# Patient Record
Sex: Male | Born: 1965 | Race: White | Hispanic: No | Marital: Married | Smoking: Never smoker
Health system: Southern US, Community
[De-identification: ages and names within clinical notes are randomized; demographics above are authoritative.]

## PROBLEM LIST (undated history)

## (undated) DIAGNOSIS — G253 Myoclonus: Secondary | ICD-10-CM

## (undated) DIAGNOSIS — G500 Trigeminal neuralgia: Secondary | ICD-10-CM

## (undated) DIAGNOSIS — G4730 Sleep apnea, unspecified: Secondary | ICD-10-CM

## (undated) DIAGNOSIS — M542 Cervicalgia: Secondary | ICD-10-CM

## (undated) DIAGNOSIS — K219 Gastro-esophageal reflux disease without esophagitis: Secondary | ICD-10-CM

## (undated) HISTORY — PX: NOSE SURGERY: SHX723

## (undated) HISTORY — DX: Gastro-esophageal reflux disease without esophagitis: K21.9

## (undated) HISTORY — DX: Sleep apnea, unspecified: G47.30

## (undated) HISTORY — PX: HERNIA REPAIR: SHX51

## (undated) HISTORY — DX: Myoclonus: G25.3

## (undated) HISTORY — DX: Cervicalgia: M54.2

## (undated) HISTORY — PX: OTHER SURGICAL HISTORY: SHX169

---

## 1998-03-26 ENCOUNTER — Emergency Department (HOSPITAL_COMMUNITY): Admission: EM | Admit: 1998-03-26 | Discharge: 1998-03-26 | Payer: Self-pay | Admitting: Emergency Medicine

## 2005-09-25 ENCOUNTER — Ambulatory Visit (HOSPITAL_COMMUNITY)
Admission: RE | Admit: 2005-09-25 | Discharge: 2005-09-25 | Payer: Self-pay | Admitting: Orthodontics and Dentofacial Orthopedics

## 2012-05-22 ENCOUNTER — Encounter: Payer: Self-pay | Admitting: *Deleted

## 2012-09-17 ENCOUNTER — Encounter: Payer: Self-pay | Admitting: Cardiovascular Disease

## 2014-06-08 DIAGNOSIS — M4712 Other spondylosis with myelopathy, cervical region: Secondary | ICD-10-CM | POA: Insufficient documentation

## 2015-06-23 ENCOUNTER — Encounter (HOSPITAL_BASED_OUTPATIENT_CLINIC_OR_DEPARTMENT_OTHER): Payer: Self-pay | Admitting: *Deleted

## 2015-06-23 ENCOUNTER — Emergency Department (HOSPITAL_BASED_OUTPATIENT_CLINIC_OR_DEPARTMENT_OTHER)
Admission: EM | Admit: 2015-06-23 | Discharge: 2015-06-23 | Disposition: A | Discharge status: Home / Self Care | Payer: BLUE CROSS/BLUE SHIELD | Attending: Emergency Medicine | Admitting: Emergency Medicine

## 2015-06-23 ENCOUNTER — Emergency Department (HOSPITAL_BASED_OUTPATIENT_CLINIC_OR_DEPARTMENT_OTHER): Payer: BLUE CROSS/BLUE SHIELD

## 2015-06-23 DIAGNOSIS — Z8669 Personal history of other diseases of the nervous system and sense organs: Secondary | ICD-10-CM | POA: Diagnosis not present

## 2015-06-23 DIAGNOSIS — Y9389 Activity, other specified: Secondary | ICD-10-CM | POA: Diagnosis not present

## 2015-06-23 DIAGNOSIS — S59902A Unspecified injury of left elbow, initial encounter: Secondary | ICD-10-CM | POA: Insufficient documentation

## 2015-06-23 DIAGNOSIS — Y998 Other external cause status: Secondary | ICD-10-CM | POA: Diagnosis not present

## 2015-06-23 DIAGNOSIS — Z79899 Other long term (current) drug therapy: Secondary | ICD-10-CM | POA: Insufficient documentation

## 2015-06-23 DIAGNOSIS — W01198A Fall on same level from slipping, tripping and stumbling with subsequent striking against other object, initial encounter: Secondary | ICD-10-CM | POA: Diagnosis not present

## 2015-06-23 DIAGNOSIS — Y9289 Other specified places as the place of occurrence of the external cause: Secondary | ICD-10-CM | POA: Diagnosis not present

## 2015-06-23 DIAGNOSIS — M25522 Pain in left elbow: Secondary | ICD-10-CM

## 2015-06-23 HISTORY — DX: Trigeminal neuralgia: G50.0

## 2015-06-23 NOTE — ED Notes (Signed)
Left elbow injury 4 weeks ago. He hit his arm on his car.

## 2015-06-23 NOTE — Discharge Instructions (Signed)
Rest.  Ibuprofen 600 mg 3 times daily for the next 5 days.  If you're not improving in the next week, follow up with your primary Dr. for referral to physical therapy or possibly further imaging.   Musculoskeletal Pain Musculoskeletal pain is muscle and boney aches and pains. These pains can occur in any part of the body. Your caregiver may treat you without knowing the cause of the pain. They may treat you if blood or urine tests, X-rays, and other tests were normal.  CAUSES There is often not a definite cause or reason for these pains. These pains may be caused by a type of germ (virus). The discomfort may also come from overuse. Overuse includes working out too hard when your body is not fit. Boney aches also come from weather changes. Bone is sensitive to atmospheric pressure changes. HOME CARE INSTRUCTIONS   Ask when your test results will be ready. Make sure you get your test results.  Only take over-the-counter or prescription medicines for pain, discomfort, or fever as directed by your caregiver. If you were given medications for your condition, do not drive, operate machinery or power tools, or sign legal documents for 24 hours. Do not drink alcohol. Do not take sleeping pills or other medications that may interfere with treatment.  Continue all activities unless the activities cause more pain. When the pain lessens, slowly resume normal activities. Gradually increase the intensity and duration of the activities or exercise.  During periods of severe pain, bed rest may be helpful. Lay or sit in any position that is comfortable.  Putting ice on the injured area.  Put ice in a bag.  Place a towel between your skin and the bag.  Leave the ice on for 15 to 20 minutes, 3 to 4 times a day.  Follow up with your caregiver for continued problems and no reason can be found for the pain. If the pain becomes worse or does not go away, it may be necessary to repeat tests or do additional  testing. Your caregiver may need to look further for a possible cause. SEEK IMMEDIATE MEDICAL CARE IF:  You have pain that is getting worse and is not relieved by medications.  You develop chest pain that is associated with shortness or breath, sweating, feeling sick to your stomach (nauseous), or throw up (vomit).  Your pain becomes localized to the abdomen.  You develop any new symptoms that seem different or that concern you. MAKE SURE YOU:   Understand these instructions.  Will watch your condition.  Will get help right away if you are not doing well or get worse. Document Released: 11/25/2005 Document Revised: 02/17/2012 Document Reviewed: 07/30/2013 [facility] Patient Information 2015 [place], [place]. This information is not intended to replace advice given to you by your health care provider. Make sure you discuss any questions you have with your health care provider.

## 2015-06-23 NOTE — ED Provider Notes (Signed)
CSN: [id number]     Arrival date & time 06/23/15  1453 History   First MD Initiated Contact with Patient 06/23/15 1538     Chief Complaint  Patient presents with  . Elbow Injury     (Consider location/radiation/quality/duration/timing/severity/associated sxs/prior Treatment) HPI Comments: Patient is a 49 year old male with no significant past medical history. He presents with complaints of left elbow pain. He states he is getting out of his car 4 weeks ago when he slipped, fell, and struck his elbow on the side of the vehicle. He has been having discomfort since that time. He denies any numbness or tingling. He denies any weakness. This had his pain is worse when he attempts to workout at the gym, and is relieved with rest.  Patient is a 49 y.o. male presenting with arm injury. The history is provided by the patient.  Arm Injury Location:  Elbow Time since incident:  4 weeks Injury: yes   Mechanism of injury: fall   Elbow location:  R elbow Pain details:    Severity:  Moderate   Onset quality:  Sudden   Duration:  4 weeks   Timing:  Constant   Progression:  Worsening Chronicity:  New Dislocation: no   Relieved by:  Rest Worsened by:  Movement   Past Medical History  Diagnosis Date  . Trigeminal neuralgia    Past Surgical History  Procedure Laterality Date  . Nose surgery   age 49   No family history on file. History  Substance Use Topics  . Smoking status: Never Smoker   . Smokeless tobacco: Not on file  . Alcohol Use: Yes     Comment: occasional    Review of Systems  All other systems reviewed and are negative.     Allergies  Review of patient's allergies indicates no known allergies.  Home Medications   Prior to Admission medications   Medication Sig Start Date End Date Taking? Authorizing Provider  Oxcarbazepine (TRILEPTAL) 300 MG tablet Take 300 mg by mouth 2 (two) times daily.    Historical Provider, MD   BP 128/89 mmHg  Pulse 66  Temp(Src) 97.6  F (36.4 C) (Oral)  Resp 20  Ht 5\' 8"  (1.727 m)  Wt 160 lb (72.576 kg)  BMI 24.33 kg/m2  SpO2 99% Physical Exam  Constitutional: He is oriented to person, place, and time. He appears well-developed and well-nourished. No distress.  HENT:  Head: Normocephalic and atraumatic.  Neck: Normal range of motion. Neck supple.  Musculoskeletal:  The left elbow appears grossly normal. There is no significant swelling or deformity. He has good range of motion. Distal ulnar and radial pulses are easily palpable. He is able to flex, extend, and oppose all fingers. Sensation is intact throughout the entire hand.  Neurological: He is alert and oriented to person, place, and time.  Skin: Skin is warm and dry. He is not diaphoretic.  Nursing note and vitals reviewed.   ED Course  Procedures (including critical care time) Labs Review Labs Reviewed - No data to display  Imaging Review Dg Elbow Complete Left  06/23/2015   CLINICAL DATA:  Injured 4 weeks ago 0 after slipping and falling backwards, striking the elbow on an automobile.  EXAM: LEFT ELBOW - COMPLETE 3+ VIEW  COMPARISON:  None.  FINDINGS: There is no evidence of fracture, dislocation, or joint effusion. There is no evidence of arthropathy or other focal bone abnormality. Soft tissues are unremarkable.  IMPRESSION: Normal   Electronically Signed  By: Paulina Fusi M.D.   On: 06/23/2015 15:35     EKG Interpretation None      MDM   Final diagnoses:  None    Patient appears to have some sort of soft tissue injury as his x-rays are negative. I suspect tendinitis. I will recommend rest, anti-inflammatory's, and when necessary return with his primary doctor.    Geoffery Lyons, MD 06/23/15 [phone]

## 2015-07-16 ENCOUNTER — Emergency Department (HOSPITAL_COMMUNITY): Payer: BLUE CROSS/BLUE SHIELD

## 2015-07-16 ENCOUNTER — Encounter (HOSPITAL_COMMUNITY): Payer: Self-pay | Admitting: Emergency Medicine

## 2015-07-16 ENCOUNTER — Inpatient Hospital Stay (HOSPITAL_COMMUNITY)
Admission: EM | Admit: 2015-07-16 | Discharge: 2015-07-17 | DRG: 305 | Disposition: A | Discharge status: Home / Self Care | Payer: BLUE CROSS/BLUE SHIELD | Attending: Family Medicine | Admitting: Family Medicine

## 2015-07-16 DIAGNOSIS — R739 Hyperglycemia, unspecified: Secondary | ICD-10-CM | POA: Diagnosis present

## 2015-07-16 DIAGNOSIS — J309 Allergic rhinitis, unspecified: Secondary | ICD-10-CM | POA: Diagnosis present

## 2015-07-16 DIAGNOSIS — R7309 Other abnormal glucose: Secondary | ICD-10-CM

## 2015-07-16 DIAGNOSIS — H538 Other visual disturbances: Secondary | ICD-10-CM | POA: Diagnosis present

## 2015-07-16 DIAGNOSIS — G500 Trigeminal neuralgia: Secondary | ICD-10-CM | POA: Diagnosis present

## 2015-07-16 DIAGNOSIS — M25522 Pain in left elbow: Secondary | ICD-10-CM | POA: Diagnosis present

## 2015-07-16 DIAGNOSIS — Z79899 Other long term (current) drug therapy: Secondary | ICD-10-CM | POA: Diagnosis not present

## 2015-07-16 DIAGNOSIS — R0789 Other chest pain: Secondary | ICD-10-CM | POA: Diagnosis present

## 2015-07-16 DIAGNOSIS — I161 Hypertensive emergency: Secondary | ICD-10-CM | POA: Diagnosis present

## 2015-07-16 DIAGNOSIS — R079 Chest pain, unspecified: Secondary | ICD-10-CM | POA: Diagnosis not present

## 2015-07-16 DIAGNOSIS — I10 Essential (primary) hypertension: Secondary | ICD-10-CM | POA: Diagnosis present

## 2015-07-16 DIAGNOSIS — R1310 Dysphagia, unspecified: Secondary | ICD-10-CM | POA: Diagnosis present

## 2015-07-16 LAB — APTT: aPTT: 28 seconds (ref 24–37)

## 2015-07-16 LAB — ETHANOL: Alcohol, Ethyl (B): <5 mg/dL (ref <5)

## 2015-07-16 LAB — BASIC METABOLIC PANEL
Anion gap: 8 (ref 5–15)
BUN: 12 mg/dL (ref 6–20)
CALCIUM: 10 mg/dL (ref 8.9–10.3)
CO2: 30 mmol/L (ref 22–32)
CREATININE: 1.02 mg/dL (ref 0.61–1.24)
Chloride: 96 mmol/L — ABNORMAL LOW (ref 101–111)
GFR calc Af Amer: >60 mL/min (ref >60)
GFR calc non Af Amer: >60 mL/min (ref >60)
Glucose, Bld: 128 mg/dL — ABNORMAL HIGH (ref 65–99)
POTASSIUM: 3.9 mmol/L (ref 3.5–5.1)
Sodium: 134 mmol/L — ABNORMAL LOW (ref 135–145)

## 2015-07-16 LAB — I-STAT CHEM 8, ED
BUN: 15 mg/dL (ref 6–20)
Calcium, Ion: 1.25 mmol/L — ABNORMAL HIGH (ref 1.12–1.23)
Chloride: 94 mmol/L — ABNORMAL LOW (ref 101–111)
Creatinine, Ser: 1 mg/dL (ref 0.61–1.24)
Glucose, Bld: 124 mg/dL — ABNORMAL HIGH (ref 65–99)
HEMATOCRIT: 45 % (ref 39.0–52.0)
Hemoglobin: 15.3 g/dL (ref 13.0–17.0)
Potassium: 3.8 mmol/L (ref 3.5–5.1)
Sodium: 135 mmol/L (ref 135–145)
TCO2: 28 mmol/L (ref 0–100)

## 2015-07-16 LAB — URINALYSIS, ROUTINE W REFLEX MICROSCOPIC
Bilirubin Urine: NEGATIVE
GLUCOSE, UA: NEGATIVE mg/dL
Hgb urine dipstick: NEGATIVE
Ketones, ur: NEGATIVE mg/dL
Leukocytes, UA: NEGATIVE
Nitrite: NEGATIVE
PROTEIN: NEGATIVE mg/dL
Specific Gravity, Urine: 1.015 (ref 1.005–1.030)
Urobilinogen, UA: 0.2 mg/dL (ref 0.0–1.0)
pH: 7.5 (ref 5.0–8.0)

## 2015-07-16 LAB — CBC
HCT: 40.8 % (ref 39.0–52.0)
Hemoglobin: 14.7 g/dL (ref 13.0–17.0)
MCH: 33.6 pg (ref 26.0–34.0)
MCHC: 36 g/dL (ref 30.0–36.0)
MCV: 93.2 fL (ref 78.0–100.0)
Platelets: 177 10*3/uL (ref 150–400)
RBC: 4.38 MIL/uL (ref 4.22–5.81)
RDW: 12 % (ref 11.5–15.5)
WBC: 5.7 10*3/uL (ref 4.0–10.5)

## 2015-07-16 LAB — COMPREHENSIVE METABOLIC PANEL
ALT: 25 U/L (ref 17–63)
AST: 46 U/L — ABNORMAL HIGH (ref 15–41)
Albumin: 4.6 g/dL (ref 3.5–5.0)
Alkaline Phosphatase: 62 U/L (ref 38–126)
Anion gap: 8 (ref 5–15)
BILIRUBIN TOTAL: 0.2 mg/dL — AB (ref 0.3–1.2)
BUN: 12 mg/dL (ref 6–20)
CO2: 30 mmol/L (ref 22–32)
Calcium: 9.9 mg/dL (ref 8.9–10.3)
Chloride: 97 mmol/L — ABNORMAL LOW (ref 101–111)
Creatinine, Ser: 1.02 mg/dL (ref 0.61–1.24)
GFR calc non Af Amer: >60 mL/min (ref >60)
GLUCOSE: 126 mg/dL — AB (ref 65–99)
Potassium: 3.8 mmol/L (ref 3.5–5.1)
SODIUM: 135 mmol/L (ref 135–145)
Total Protein: 7 g/dL (ref 6.5–8.1)

## 2015-07-16 LAB — RAPID URINE DRUG SCREEN, HOSP PERFORMED
Amphetamines: NOT DETECTED
Barbiturates: NOT DETECTED
Benzodiazepines: NOT DETECTED
COCAINE: NOT DETECTED
Opiates: NOT DETECTED
Tetrahydrocannabinol: NOT DETECTED

## 2015-07-16 LAB — DIFFERENTIAL
BASOS PCT: 0 % (ref 0–1)
Basophils Absolute: 0 10*3/uL (ref 0.0–0.1)
Eosinophils Absolute: 0 10*3/uL (ref 0.0–0.7)
Eosinophils Relative: 1 % (ref 0–5)
LYMPHS ABS: 1.2 10*3/uL (ref 0.7–4.0)
Lymphocytes Relative: 22 % (ref 12–46)
MONOS PCT: 9 % (ref 3–12)
Monocytes Absolute: 0.5 10*3/uL (ref 0.1–1.0)
Neutro Abs: 3.8 10*3/uL (ref 1.7–7.7)
Neutrophils Relative %: 68 % (ref 43–77)

## 2015-07-16 LAB — PROTIME-INR
INR: 1.07 (ref 0.00–1.49)
Prothrombin Time: 14.1 seconds (ref 11.6–15.2)

## 2015-07-16 LAB — I-STAT TROPONIN, ED: Troponin i, poc: 0 ng/mL (ref 0.00–0.08)

## 2015-07-16 MED ORDER — LABETALOL HCL 5 MG/ML IV SOLN
10.0000 mg | Freq: Once | INTRAVENOUS | Status: AC
  Administered 2015-07-16: 10 mg via INTRAVENOUS
  Filled 2015-07-16: qty 4

## 2015-07-16 NOTE — ED Provider Notes (Signed)
CSN: [id number]     Arrival date & time 07/16/15  2111 History   First MD Initiated Contact with Patient 07/16/15 2132     Chief Complaint  Patient presents with  . Blurred Vision  . Chest Pain  . Hypertension  . Code Stroke     (Consider location/radiation/quality/duration/timing/severity/associated sxs/prior Treatment) HPI  49 year old male presents by EMS for acute blurry vision. Started around 8 PM while he was eating dinner. He states he has never had this before and has no past medical history except trigeminal neuralgia. No prior history of hypertension and states every time he is checks blood pressure it has been normal. The patient states that it feels like his eyes are blurry and there is no double vision. This affects both eyes. The patient also states he's been feeling weak in his left arm from the elbow down for the past one month. One month ago he fell and landed on his elbow and since then has been having pain and weakness. No numbness. The patient developed left chest pressure about 15 minutes after the blurry vision started. This is been waxing and waning but not gone. No shortness of breath. No nausea or vomiting. EMS gave him one nitroglycerin due to hypertension and he states since then he has developed a headache but did not have a headache before. When he first developed the blurry vision family decided to come to the hospital but when he stood up he was dizzy and had to sit back down his legs were weak.  Past Medical History  Diagnosis Date  . Trigeminal neuralgia    Past Surgical History  Procedure Laterality Date  . Nose surgery   age 37   History reviewed. No pertinent family history. History  Substance Use Topics  . Smoking status: Never Smoker   . Smokeless tobacco: Not on file  . Alcohol Use: Yes     Comment: occasional    Review of Systems  Eyes: Positive for visual disturbance. Negative for pain.  Respiratory: Negative for shortness of breath.    Cardiovascular: Positive for chest pain.  Gastrointestinal: Negative for vomiting.  Neurological: Positive for dizziness, weakness and headaches.  All other systems reviewed and are negative.     Allergies  Review of patient's allergies indicates no known allergies.  Home Medications   Prior to Admission medications   Medication Sig Start Date End Date Taking? Authorizing Provider  Oxcarbazepine (TRILEPTAL) 300 MG tablet Take 300 mg by mouth 2 (two) times daily.    Historical Provider, MD   BP 190/106 mmHg  Pulse 80  Temp(Src) 98.8 F (37.1 C) (Oral)  Resp 16  SpO2 97% Physical Exam  Constitutional: He is oriented to person, place, and time. He appears well-developed and well-nourished.  HENT:  Head: Normocephalic and atraumatic.  Right Ear: External ear normal.  Left Ear: External ear normal.  Nose: Nose normal.  Eyes: Conjunctivae and EOM are normal. Pupils are equal, round, and reactive to light. Right eye exhibits no discharge. Left eye exhibits no discharge.  Neck: Neck supple.  Cardiovascular: Normal rate, regular rhythm, normal heart sounds and intact distal pulses.   Pulmonary/Chest: Effort normal and breath sounds normal.  Abdominal: Soft. There is no tenderness.  Musculoskeletal: He exhibits no edema.  Neurological: He is alert and oriented to person, place, and time.  CN 2-12 grossly intact. 5/5 strength in all 4 extremities. Grossly normal sensation. Normal finger to nose. When patient covers up one eye the other eye's  vision is normal bilaterally. Blurred vision when both eyes open.  Skin: Skin is warm and dry.  Nursing note and vitals reviewed.   ED Course  Procedures (including critical care time) Labs Review Labs Reviewed  BASIC METABOLIC PANEL - Abnormal; Notable for the following:    Sodium 134 (*)    Chloride 96 (*)    Glucose, Bld 128 (*)    All other components within normal limits  COMPREHENSIVE METABOLIC PANEL - Abnormal; Notable for the  following:    Chloride 97 (*)    Glucose, Bld 126 (*)    AST 46 (*)    Total Bilirubin 0.2 (*)    All other components within normal limits  I-STAT CHEM 8, ED - Abnormal; Notable for the following:    Chloride 94 (*)    Glucose, Bld 124 (*)    Calcium, Ion 1.25 (*)    All other components within normal limits  CBC  ETHANOL  DIFFERENTIAL  PROTIME-INR  APTT  URINE RAPID DRUG SCREEN, HOSP PERFORMED  URINALYSIS, ROUTINE W REFLEX MICROSCOPIC (NOT AT [facility])  I-STAT TROPOININ, ED    Imaging Review Ct Head Wo Contrast  07/16/2015   CLINICAL DATA:  Acute onset blurred vision with nausea and dizziness ; left upper extremity tingling  EXAM: CT HEAD WITHOUT CONTRAST  TECHNIQUE: Contiguous axial images were obtained from the base of the skull through the vertex without intravenous contrast.  COMPARISON:  January 12, 2011  FINDINGS: The ventricles are normal in size and configuration. There is no intracranial mass, hemorrhage, extra-axial fluid collection, or midline shift. Gray and white compartments appear normal. No acute infarct evident. Middle cerebral arteries show normal attenuation bilaterally. Bony calvarium appears intact. The mastoid air cells are clear.  IMPRESSION: Study within normal limits.  Critical Value/emergent results were called by telephone at the time of interpretation on 07/16/2015 at 10:25 pm to Dr. Thad Ranger, neurology , who verbally acknowledged these results.   Electronically Signed   By: Bretta Bang III M.D.   On: 07/16/2015 22:25     EKG Interpretation   Date/Time:  Sunday July 16 2015 21:33:21 EDT Ventricular Rate:  81 PR Interval:  155 QRS Duration: 107 QT Interval:  366 QTC Calculation: 425 R Axis:   86 Text Interpretation:  Sinus rhythm Probable left atrial enlargement  Anteroseptal infarct, old No old tracing to compare Confirmed by Dashon Mcintire   MD, Shelbylynn Walczyk ([id number]) on 07/16/2015 9:40:22 PM      CRITICAL CARE Performed by: Pricilla Loveless T   Total  critical care time: 30 minutes  Critical care time was exclusive of separately billable procedures and treating other patients.  Critical care was necessary to treat or prevent imminent or life-threatening deterioration.  Critical care was time spent personally by me on the following activities: development of treatment plan with patient and/or surrogate as well as nursing, discussions with consultants, evaluation of patient's response to treatment, examination of patient, obtaining history from patient or surrogate, ordering and performing treatments and interventions, ordering and review of laboratory studies, ordering and review of radiographic studies, pulse oximetry and re-evaluation of patient's condition.  MDM   Final diagnoses:  Hypertensive emergency    9:57 PM Discussed with Dr. Thad Ranger of neurology. At this point we will call a code stroke given time of onset and symptoms. Patient will go to CT scan and then further disposition and workup will be made.  Patient's symptoms have improved after being given IV labetalol by neuro. Given this,  this is more likely hypertensive emergency rather than acute stroke. No further stroke workup indicated. Will need admission for tighter BP control and monitoring. His chest pain is somewhat atypical and given no EKG changes or troponin elevation I do not feel that this is ACS. Will need tighter blood pressure control, admit to the step down unit under family medicine.  Pricilla Loveless, MD 07/16/15 2322

## 2015-07-16 NOTE — ED Notes (Signed)
Patient stated denies any blurry vision.

## 2015-07-16 NOTE — ED Notes (Signed)
Pt reports eating dinner and experiencing sudden onset of blurred vision; pt also reports substernal CP that is intermittent that began shortly after with nausea and dizziness with standing; pt received  ASA and 1 SL nitro enroute by EMS without improvement; pt also received  zofran; pt also reports tingling in L arm- sts he hit arm while back and still gives him problems (has been seen for this)

## 2015-07-16 NOTE — ED Notes (Signed)
ED Doctor at bedside.  

## 2015-07-16 NOTE — H&P (Signed)
Family Medicine Teaching [facility] Admission History and Physical Service Pager: [phone]  Patient name: Ronald Chapman Medical record number: [id number] Date of birth: [birth year] Age: 49 y.o. Gender: male  Primary Care Provider: Cheral Bay, MD Consultants: Neuro (in ED) Code Status: Full  Chief Complaint: blurry vision  Assessment and Plan: Ronald Chapman is a 49 y.o. male presenting with hypertensive emergency. PMH is significant for trigeminal neuralgia  # Hypertensive emergency: 190s/110. symptoms of blurry vision. No evidence of kidney or liver damage. No prior diagnosis of HTN. Code stroke called in ED and head CT was negatuve of pathology. Received 10mg  IV labetalol with excellent BP response and resolution of blurry vision. Evaluated by neurology and felt this was hypertensive related and not stroke/CVA.  - labetalol IV PRN overnight for SBP >180, DBP >110 - if BPs still elevated consider oral agent in AM - telemetry  # Chest pain: likely secondary to above. Initial troponin negative. EKG NSR, ?incomplete RBBB. CXR no acute pathology.  - cycle troponins - start daily ASA  # Elevated blood sugar: likely due to stress/pain reaction - if AM BMP still elevated, check A1c  # Trigeminal neuralgia: had recent MRI brain for monitoring as it has been giving him more pain lately (last MRI was ~8 years ago). - continue trileptal  # Left elbow/arm pain: has ortho appt this week. In setting of fall around June 2016. Had normal plain film in July. - monitor clinically  FEN/GI: diet reg / saline lock Prophylaxis: heparin sq  Disposition: admit  History of Present Illness: Ronald Chapman is a 49 y.o. male presenting with blurry vision. Pt states he was eating dinner around 8pm when he developed blurry vision. It subsided for a few minutes but came back worse and also developed chest pain around the same time. Chest pain was located around left side, felt like a  squeezing. He called a friend (doctor) who told him to go to the ED. EMS was called and gave nitro en route (promptly gave him a headache). While in the ED he was given IV labetalol which brought his BP down well and he states the blurry vision had completely resolved. He states he still has a small amount of chest pain in his upper left chest, continues to feel like a squeezing but is improved overall.   Review Of Systems: Per HPI with the following additions: some difficulty swallowing over past few weeks, no HA prior to nitro, no fever/chills, no n/v, no weakness. Otherwise 12 point review of systems was performed and was unremarkable.  Patient Active Problem List   Diagnosis Date Noted  . Hypertensive emergency 07/16/2015   Past Medical History: Past Medical History  Diagnosis Date  . Trigeminal neuralgia    Past Surgical History: Past Surgical History  Procedure Laterality Date  . Nose surgery   age 34   Social History: History  Substance Use Topics  . Smoking status: Never Smoker   . Smokeless tobacco: Not on file  . Alcohol Use: Yes     Comment: occasional   Additional social history: works as a IT trainer, very stressful in past 6 months, drinking more alcohol for this and for his trigeminal neuralgia  Please also refer to relevant sections of EMR.  Family History: History reviewed. No pertinent family history. Allergies and Medications: No Known Allergies No current facility-administered medications on file prior to encounter.   Current Outpatient Prescriptions on File Prior to Encounter  Medication Sig Dispense Refill  .  Oxcarbazepine (TRILEPTAL) 300 MG tablet Take 300 mg by mouth 2 (two) times daily.      Objective: BP 148/97 mmHg  Pulse 81  Temp(Src) 98.8 F (37.1 C) (Oral)  Resp 16  Wt 160 lb 0.9 oz (72.6 kg)  SpO2 99% Exam: General: NAD Eyes: PERRL, EOMI. Conjunctiva normal. ENTM: MMM, no oropharyngeal lesions Neck: normal, supple Cardiovascular: RRR,  normal s1 and s2, no m/r/g. 2+ radial, DP pulses bilaterally. No LE edema. Respiratory: CTAB, normal effort Abdomen: soft, thin, NTND, normal bowel sounds MSK: normal bulk/tone. No deformities. ROM left elbow intact. Skin: no rashes Neuro: alert and oriented x 3. CN2-12 normal. No pronator drift. Strength grossly normal UE/LE bilaterally. Psych: normal mood and affect. Thought content is normal. Speech is normal.  Labs and Imaging: CBC BMET   Recent Labs Lab 07/16/15 2152 07/16/15 2205  WBC 5.7  --   HGB 14.7 15.3  HCT 40.8 45.0  PLT 177  --     Recent Labs Lab 07/16/15 2152 07/16/15 2205  NA 135  134* 135  K 3.8  3.9 3.8  CL 97*  96* 94*  CO2 30  30  --   BUN CREATININE 1.02  1.02 1.00  GLUCOSE 126*  128* 124*  CALCIUM 9.9  10.0  --      Dg Chest 2 View  07/16/2015   CLINICAL DATA:  Acute onset of blurred vision and substernal chest pain. Nausea and dizziness. High blood pressure. Initial encounter.  EXAM: CHEST  2 VIEW  COMPARISON:  None.  FINDINGS: The lungs are well-aerated and clear. There is no evidence of focal opacification, pleural effusion or pneumothorax.  The heart is normal in size; the mediastinal contour is within normal limits. No acute osseous abnormalities are seen.  IMPRESSION: No acute cardiopulmonary process seen.   Electronically Signed   By: Roanna Raider M.D.   On: 07/16/2015 23:39   Dg Elbow Complete Left  06/23/2015   CLINICAL DATA:  Injured 4 weeks ago 0 after slipping and falling backwards, striking the elbow on an automobile.  EXAM: LEFT ELBOW - COMPLETE 3+ VIEW  COMPARISON:  None.  FINDINGS: There is no evidence of fracture, dislocation, or joint effusion. There is no evidence of arthropathy or other focal bone abnormality. Soft tissues are unremarkable.  IMPRESSION: Normal   Electronically Signed   By: Paulina Fusi M.D.   On: 06/23/2015 15:35   Ct Head Wo Contrast  07/16/2015   CLINICAL DATA:  Acute onset blurred vision with  nausea and dizziness ; left upper extremity tingling  EXAM: CT HEAD WITHOUT CONTRAST  TECHNIQUE: Contiguous axial images were obtained from the base of the skull through the vertex without intravenous contrast.  COMPARISON:  January 12, 2011  FINDINGS: The ventricles are normal in size and configuration. There is no intracranial mass, hemorrhage, extra-axial fluid collection, or midline shift. Gray and white compartments appear normal. No acute infarct evident. Middle cerebral arteries show normal attenuation bilaterally. Bony calvarium appears intact. The mastoid air cells are clear.  IMPRESSION: Study within normal limits.  Critical Value/emergent results were called by telephone at the time of interpretation on 07/16/2015 at 10:25 pm to Dr. Thad Ranger, neurology , who verbally acknowledged these results.   Electronically Signed   By: Bretta Bang III M.D.   On: 07/16/2015 22:25     Nani Ravens, MD 07/16/2015, 11:06 PM PGY-3, [facility] FPTS Intern pager: [phone], text pages welcome

## 2015-07-16 NOTE — Consult Note (Signed)
Referring Physician: Criss Alvine    Chief Complaint: Blurry vision  HPI: BETZALEL UMBARGER is an 49 y.o. male who was eating this evening and had acute onset blurry vision.  Patient also experienced chest pain with some associated nausea and dizziness with standing.  On testing vision improves if either eye is covered.  EMS was called.  NTG was administered after which the patient developed a headache.  BP elevated.  On arrival patient also reports that his legs feel heavy.    Date last known well: Date: 07/16/2015 Time last known well: Time: 20:00 tPA Given: No: Resolution of symptoms  Past Medical History  Diagnosis Date  . Trigeminal neuralgia     Past Surgical History  Procedure Laterality Date  . Nose surgery   age 47    History reviewed. No pertinent family history.   Social History:  reports that he has never smoked. He does not have any smokeless tobacco history on file. He reports that he drinks alcohol. He reports that he does not use illicit drugs.  Allergies: No Known Allergies  Medications: I have reviewed the patient's current medications. Prior to Admission:  Prior to Admission medications   Medication Sig Start Date End Date Taking? Authorizing Provider  fexofenadine (ALLEGRA) 180 MG tablet Take 180 mg by mouth daily as needed for allergies or rhinitis.   Yes Historical Provider, MD  Oxcarbazepine (TRILEPTAL) 300 MG tablet Take 600 mg by mouth 3 (three) times daily.    Yes Historical Provider, MD    ROS: History obtained from the patient  General ROS: negative for - chills, fatigue, fever, night sweats, weight gain or weight loss Psychological ROS: negative for - behavioral disorder, hallucinations, memory difficulties, mood swings or suicidal ideation Ophthalmic ROS: negative for - blurry vision, double vision, eye pain or loss of vision ENT ROS: negative for - epistaxis, nasal discharge, oral lesions, sore throat, tinnitus or vertigo Allergy and Immunology ROS:  negative for - hives or itchy/watery eyes Hematological and Lymphatic ROS: negative for - bleeding problems, bruising or swollen lymph nodes Endocrine ROS: negative for - galactorrhea, hair pattern changes, polydipsia/polyuria or temperature intolerance Respiratory ROS: negative for - cough, hemoptysis, shortness of breath or wheezing Cardiovascular ROS: chest pain Gastrointestinal ROS: negative for - abdominal pain, diarrhea, hematemesis, nausea/vomiting or stool incontinence Genito-Urinary ROS: negative for - dysuria, hematuria, incontinence or urinary frequency/urgency Musculoskeletal ROS: elbow pain Neurological ROS: as noted in HPI Dermatological ROS: negative for rash and skin lesion changes  Physical Examination: Blood pressure 122/87, pulse 67, temperature 98.4 F (36.9 C), temperature source Oral, resp. rate 15, weight 72.6 kg (160 lb 0.9 oz), SpO2 95 %.  HEENT-  Normocephalic, no lesions, without obvious abnormality.  Normal external eye and conjunctiva.  Normal TM's bilaterally.  Normal auditory canals and external ears. Normal external nose, mucus membranes and septum.  Normal pharynx. Cardiovascular- S1, S2 normal, pulses palpable throughout   Lungs- chest clear, no wheezing, rales, normal symmetric air entry Abdomen- soft, non-tender; bowel sounds normal; no masses,  no organomegaly Extremities- no edema Lymph-no adenopathy palpable Musculoskeletal-no joint tenderness, deformity or swelling Skin-warm and dry, no hyperpigmentation, vitiligo, or suspicious lesions  Neurological Examination Mental Status: Alert, oriented, thought content appropriate.  Speech fluent without evidence of aphasia.  Able to follow 3 step commands without difficulty. Cranial Nerves: II: Discs flat bilaterally; Visual fields grossly normal, pupils equal, round, reactive to light and accommodation III,IV, VI: ptosis not present, extra-ocular motions intact bilaterally V,VII: smile symmetric, facial  light touch sensation decreased on the right VIII: hearing normal bilaterally IX,X: gag reflex present XI: bilateral shoulder shrug XII: midline tongue extension Motor: Right : Upper extremity   5/5    Left:     Upper extremity   5/5  Lower extremity   5/5     Lower extremity   5/5 Tone and bulk:normal tone throughout; no atrophy noted Sensory: Pinprick and light touch intact throughout, bilaterally Deep Tendon Reflexes: 2+ and symmetric throughout Plantars: Right: downgoing   Left: downgoing Cerebellar: normal finger-to-nose and normal heel-to-shin testing bilaterally Gait: not tested due to feeling that legs are heavy   Laboratory Studies:  Basic Metabolic Panel:  Recent Labs Lab 07/16/15 2152 07/16/15 2205  NA 135  134* 135  K 3.8  3.9 3.8  CL 97*  96* 94*  CO2 30  30  --   GLUCOSE 126*  128* 124*  BUN 12  12 15   CREATININE 1.02  1.02 1.00  CALCIUM 9.9  10.0  --     Liver Function Tests:  Recent Labs Lab 07/16/15 2152  AST 46*  ALT 25  ALKPHOS 62  BILITOT 0.2*  PROT 7.0  ALBUMIN 4.6   No results for input(s): LIPASE, AMYLASE in the last 168 hours. No results for input(s): AMMONIA in the last 168 hours.  CBC:  Recent Labs Lab 07/16/15 2152 07/16/15 2205  WBC 5.7  --   NEUTROABS 3.8  --   HGB 14.7 15.3  HCT 40.8 45.0  MCV 93.2  --   PLT 177  --     Cardiac Enzymes: No results for input(s): CKTOTAL, CKMB, CKMBINDEX, TROPONINI in the last 168 hours.  BNP: Invalid input(s): POCBNP  CBG: No results for input(s): GLUCAP in the last 168 hours.  Microbiology: No results found for this or any previous visit.  Coagulation Studies:  Recent Labs  07/16/15 2152  LABPROT 14.1  INR 1.07    Urinalysis:   Recent Labs Lab 07/16/15 2309  COLORURINE YELLOW  LABSPEC 1.015  PHURINE 7.5  GLUCOSEU NEGATIVE  HGBUR NEGATIVE  BILIRUBINUR NEGATIVE  KETONESUR NEGATIVE  PROTEINUR NEGATIVE  UROBILINOGEN 0.2  NITRITE NEGATIVE  LEUKOCYTESUR  NEGATIVE    Lipid Panel: No results found for: CHOL, TRIG, HDL, CHOLHDL, VLDL, LDLCALC  HgbA1C: No results found for: HGBA1C  Urine Drug Screen:      Component Value Date/Time   LABOPIA NONE DETECTED 07/16/2015 2309   COCAINSCRNUR NONE DETECTED 07/16/2015 2309   LABBENZ NONE DETECTED 07/16/2015 2309   AMPHETMU NONE DETECTED 07/16/2015 2309   THCU NONE DETECTED 07/16/2015 2309   LABBARB NONE DETECTED 07/16/2015 2309    Alcohol Level:   Recent Labs Lab 07/16/15 2152  ETH <5    Other results: EKG: sinus rhythm at 81 bpm.  Imaging: Dg Chest 2 View  07/16/2015   CLINICAL DATA:  Acute onset of blurred vision and substernal chest pain. Nausea and dizziness. High blood pressure. Initial encounter.  EXAM: CHEST  2 VIEW  COMPARISON:  None.  FINDINGS: The lungs are well-aerated and clear. There is no evidence of focal opacification, pleural effusion or pneumothorax.  The heart is normal in size; the mediastinal contour is within normal limits. No acute osseous abnormalities are seen.  IMPRESSION: No acute cardiopulmonary process seen.   Electronically Signed   By: Roanna Raider M.D.   On: 07/16/2015 23:39   Ct Head Wo Contrast  07/16/2015   CLINICAL DATA:  Acute onset blurred vision with nausea and  dizziness ; left upper extremity tingling  EXAM: CT HEAD WITHOUT CONTRAST  TECHNIQUE: Contiguous axial images were obtained from the base of the skull through the vertex without intravenous contrast.  COMPARISON:  January 12, 2011  FINDINGS: The ventricles are normal in size and configuration. There is no intracranial mass, hemorrhage, extra-axial fluid collection, or midline shift. Gray and white compartments appear normal. No acute infarct evident. Middle cerebral arteries show normal attenuation bilaterally. Bony calvarium appears intact. The mastoid air cells are clear.  IMPRESSION: Study within normal limits.  Critical Value/emergent results were called by telephone at the time of interpretation  on 07/16/2015 at 10:25 pm to Dr. Thad Ranger, neurology , who verbally acknowledged these results.   Electronically Signed   By: Bretta Bang III M.D.   On: 07/16/2015 22:25    Assessment: 49 y.o. male presenting with complaints of blurred vision and bilateral leg heaviness.  BP markedly elevated.  Complaints of chest pain as well.  Head CT independent;y reviewed and shows no acute changes.  Do not suspect acute infarct based on presentation.  Symptoms likely related to elevated BP.  Symptoms improved as BP improved.      Stroke Risk Factors - none  Plan: 1. MRI of the brain without contrast 2. ASA 81mg  daily 3. Telemetry monitoring 4. Frequent neuro checks 5. BP control.  Patient with good response to Labetalol  Case discussed with Dr. Ilene Qua, MD [facility] [phone] 07/17/2015, 1:13 AM

## 2015-07-16 NOTE — ED Notes (Signed)
States had an MRI 3-4 weeks ago of brain unsure of test results.  Reason for MRI worsening right side of face tingling numbness for the past 6 months.

## 2015-07-17 ENCOUNTER — Encounter (HOSPITAL_COMMUNITY): Payer: Self-pay | Admitting: *Deleted

## 2015-07-17 DIAGNOSIS — R079 Chest pain, unspecified: Secondary | ICD-10-CM | POA: Diagnosis present

## 2015-07-17 DIAGNOSIS — G500 Trigeminal neuralgia: Secondary | ICD-10-CM | POA: Diagnosis present

## 2015-07-17 DIAGNOSIS — R0789 Other chest pain: Secondary | ICD-10-CM

## 2015-07-17 DIAGNOSIS — R739 Hyperglycemia, unspecified: Secondary | ICD-10-CM | POA: Diagnosis present

## 2015-07-17 LAB — BASIC METABOLIC PANEL
ANION GAP: 10 (ref 5–15)
BUN: 11 mg/dL (ref 6–20)
CO2: 27 mmol/L (ref 22–32)
Calcium: 9 mg/dL (ref 8.9–10.3)
Chloride: 96 mmol/L — ABNORMAL LOW (ref 101–111)
Creatinine, Ser: 1 mg/dL (ref 0.61–1.24)
GFR calc non Af Amer: >60 mL/min (ref >60)
GLUCOSE: 106 mg/dL — AB (ref 65–99)
POTASSIUM: 4 mmol/L (ref 3.5–5.1)
Sodium: 133 mmol/L — ABNORMAL LOW (ref 135–145)

## 2015-07-17 LAB — TROPONIN I
Troponin I: <0.03 ng/mL (ref <0.031)
Troponin I: <0.03 ng/mL (ref <0.031)

## 2015-07-17 MED ORDER — ASPIRIN EC 81 MG PO TBEC
81.0000 mg | DELAYED_RELEASE_TABLET | Freq: Every day | ORAL | Status: DC
  Administered 2015-07-17: 81 mg via ORAL
  Filled 2015-07-17: qty 1

## 2015-07-17 MED ORDER — PANTOPRAZOLE SODIUM 40 MG PO TBEC: 40.0000 mg | DELAYED_RELEASE_TABLET | Freq: Every day | ORAL | Status: DC

## 2015-07-17 MED ORDER — ACETAMINOPHEN 650 MG RE SUPP: 650.0000 mg | Freq: Four times a day (QID) | RECTAL | Status: DC | PRN

## 2015-07-17 MED ORDER — LORAZEPAM 1 MG PO TABS
1.0000 mg | ORAL_TABLET | Freq: Once | ORAL | Status: AC
Start: 2015-07-17 — End: 2015-07-17
  Administered 2015-07-17: 1 mg via ORAL
  Filled 2015-07-17: qty 1

## 2015-07-17 MED ORDER — OXCARBAZEPINE 300 MG PO TABS
600.0000 mg | ORAL_TABLET | Freq: Three times a day (TID) | ORAL | Status: DC
  Administered 2015-07-17 (×2): 600 mg via ORAL
  Filled 2015-07-17 (×6): qty 2

## 2015-07-17 MED ORDER — LABETALOL HCL 5 MG/ML IV SOLN: 10.0000 mg | INTRAVENOUS | Status: DC | PRN

## 2015-07-17 MED ORDER — SODIUM CHLORIDE 0.9 % IJ SOLN
3.0000 mL | INTRAMUSCULAR | Status: DC | PRN
  Administered 2015-07-17: 3 mL via INTRAVENOUS

## 2015-07-17 MED ORDER — ONDANSETRON HCL 4 MG PO TABS: 4.0000 mg | ORAL_TABLET | Freq: Four times a day (QID) | ORAL | Status: DC | PRN

## 2015-07-17 MED ORDER — ONDANSETRON HCL 4 MG/2ML IJ SOLN: 4.0000 mg | Freq: Four times a day (QID) | INTRAMUSCULAR | Status: DC | PRN

## 2015-07-17 MED ORDER — SODIUM CHLORIDE 0.9 % IV SOLN: 250.0000 mL | INTRAVENOUS | Status: DC | PRN

## 2015-07-17 MED ORDER — HEPARIN SODIUM (PORCINE) 5000 UNIT/ML IJ SOLN
5000.0000 [IU] | Freq: Three times a day (TID) | INTRAMUSCULAR | Status: DC
  Administered 2015-07-17 (×2): 5000 [IU] via SUBCUTANEOUS
  Filled 2015-07-17 (×2): qty 1

## 2015-07-17 MED ORDER — MORPHINE SULFATE 2 MG/ML IJ SOLN
1.0000 mg | INTRAMUSCULAR | Status: DC | PRN
  Filled 2015-07-17: qty 1

## 2015-07-17 MED ORDER — ACETAMINOPHEN 325 MG PO TABS
650.0000 mg | ORAL_TABLET | Freq: Four times a day (QID) | ORAL | Status: DC | PRN
  Administered 2015-07-17: 650 mg via ORAL
  Filled 2015-07-17: qty 2

## 2015-07-17 MED ORDER — ASPIRIN 81 MG PO TBEC: 81.0000 mg | DELAYED_RELEASE_TABLET | Freq: Every day | ORAL | Status: DC

## 2015-07-17 MED ORDER — SODIUM CHLORIDE 0.9 % IJ SOLN: 3.0000 mL | Freq: Two times a day (BID) | INTRAMUSCULAR | Status: DC

## 2015-07-17 MED ORDER — GI COCKTAIL ~~LOC~~
30.0000 mL | Freq: Once | ORAL | Status: AC
  Administered 2015-07-17: 30 mL via ORAL
  Filled 2015-07-17: qty 30

## 2015-07-17 NOTE — ED Notes (Signed)
Pt. Ate 75% of breakfast. 

## 2015-07-17 NOTE — Progress Notes (Signed)
FPTS Interim Progress Note  S: Called to evaluate patient as his wife did not want him to be discharged home. Discharge was planned earlier today per day team. However, patient still complaining of some chest pain about 5/10 per RN.  Wife did not  feel comfortable with discharge because of continued chest pain and wanting to know what exactly caused all of his symptoms (ie elevated BP and chest pain).   Upon entering room patient states that his pain was 3 out of 10. States that it is reproducible with palpation. Denies any radiation symptoms. Nothing has made pain better or worse.  Wife concerned that patient will be on the road traveling and this could happen again and something bad happen.  O: BP 126/91 mmHg  Pulse 72  Temp(Src) 97.9 F (36.6 C) (Oral)  Resp 13  Wt 160 lb 0.9 oz (72.6 kg)  SpO2 98%   General: NAD, alert, well-appearing Cardiac: RRR, no mr/g Chest wall: tenderness to palpation mostly over xyphoid process  A/P: Stable. Chest pain r/o ACS based off neg work-up. Appears to be MSK in nature due to reproducibility. Blood pressures have been normotensive since yesterday's IV labetalol one-time dose.  -Repeated EKG in ED which was unchanged. -GI cocktail given for relief before discharge -Protonix given at discharge -Also handout with details in AVS printout to help them understand what happened better -Will need cardiac outpatient follow-up with stress test - sees a cardiologist Dr. Elease Hashimoto -Also discussed need for close PCP follow-up to monitor BP and symptoms.  -Tried to reassure.  Cause of HTN emergency is unknown. We had r/o stroke and he suffered no end-orange damage. Also cardiac work-up was negative.  -Asked patient to avoid traveling until he can be seen by cardiologist and PCP.   They were agreeable to discharge after reassurance.  Pincus Large, DO 07/17/2015, 7:00 PM PGY-2, [facility] pager [phone]

## 2015-07-17 NOTE — Discharge Summary (Signed)
Family Medicine Teaching [facility] Discharge Summary  Patient name: Ronald Chapman Medical record number: [id number] Date of birth: 1966/07/23 Age: 49 y.o. Gender: male Date of Admission: 07/16/2015  Date of Discharge: 07/17/2015 Admitting Physician: No admitting provider for patient encounter.  Primary Care Provider: Cheral Bay, MD Consultants: Neuro (in ED)  Indication for Hospitalization: hypertensive emergency  Discharge Diagnoses/Problem List:  Recurrent chest pain  Disposition: Home  Discharge Condition: Improved  Discharge Exam:  General: Well-appearing, well-nourished man resting in bed, NAD, with wife at bedside Eyes: PERRL, EOMI.  ENTM: MMM Cardiovascular: RRR, normal s1 and s2, no murmurs, rub or gallops. No LE edema. Respiratory: CTAB, normal effort Abdomen: soft, non-tender, non-distended, + BS  MSK: No pain to palpation across chest wall. Skin: no rashes Neuro: Alert and oriented x 3. No gross focalization. Psych: Normal mood and affect.    Brief Hospital Course:  Mr. Peifer is a 48-y.o. male who presented with blurry vision, nausea and intermittent chest pain with blood pressure elevated to 190s/110. PMH is significant for trigeminal neuralgia. He was given ASA 324 mg and 1 SL nitro by EMS, which did not improve symptoms. In the ED, a code stroke was called. CT head was negative for acute change. He was given 10 mg IV labetalol with excellent blood pressure response and resolution of blurry vision. Neurology cleared.   He was admitted to the [facility] Teaching Service and underwent chest pain rule out. Cycled troponins were all negative. EKG showed only early repolarization ST elevations. CXR showed no acute cardiopulmonary process. Chest pain waxed and waned during admission. Repeat EKG showed no changes. Given that patient's BP returned to the low 110s over 80s throughout course of hospital stay after just one dose of labetalol, patient was not  discharged on any antihypertensive medication due to concern for hypotension. He was given a GI cocktail prior to discharge. Advised patient to reduce drinking. Discharged patient on ASA 81 mg and a trial of protonix 40 mg daily.   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   Issues for Follow Up:  1. Follow-up risk factors with lipid panel and Hbg A1c. 2. Cardiac stress test given intermediate risk for MI  Significant Procedures: None  Significant Labs and Imaging:   Recent Labs Lab 07/16/15 2152 07/16/15 2205  WBC 5.7  --   HGB 14.7 15.3  HCT 40.8 45.0  PLT 177  --     Recent Labs Lab 07/16/15 2152 07/16/15 2205 07/17/15 0616  NA 135  134* 135 133*  K 3.8  3.9 3.8 4.0  CL 97*  96* 94* 96*  CO2 30  30  --  27  GLUCOSE 126*  128* 124* 106*  BUN CREATININE 1.02  1.02 1.00 1.00  CALCIUM 9.9  10.0  --  9.0  ALKPHOS 62  --   --   AST 46*  --   --   ALT 25  --   --   ALBUMIN 4.6  --   --     Results/Tests Pending at Time of Discharge: None  Discharge Medications:    Medication List    TAKE these medications        aspirin 81 MG EC tablet  Take 1 tablet (81 mg total) by mouth daily.     fexofenadine 180 MG tablet  Commonly known as:  ALLEGRA  Take  180 mg by mouth daily as needed for allergies or rhinitis.     Oxcarbazepine 300 MG tablet  Commonly known as:  TRILEPTAL  Take 600 mg by mouth 3 (three) times daily.     pantoprazole 40 MG tablet  Commonly known as:  PROTONIX  Take 1 tablet (40 mg total) by mouth daily.        Discharge Instructions: Please refer to Patient Instructions section of EMR for full details.  Patient was counseled important signs and symptoms that should prompt return to medical care, changes in medications, dietary instructions, activity restrictions, and follow up appointments.   Follow-Up Appointments:  Follow-up  Information    Call Cheral Bay, MD.   Specialty:  Family Medicine   Why:  appointment for hospital follow-up   Contact information:   [address] [facility] [address] [phone]       Call Nahser, Deloris Ping, MD.   Specialty:  Cardiology   Why:  appointment for hospital follow-up and stress test   Contact information:   [address] [place] [address] [phone]       Casey Burkitt, MD 07/17/2015, 10:11 PM PGY-1, [facility]

## 2015-07-17 NOTE — ED Notes (Signed)
Patient moving to POD C to wait for bed assignment, patient and family aware of poc

## 2015-07-17 NOTE — ED Notes (Signed)
Report from Elizabeth, RN

## 2015-07-17 NOTE — ED Notes (Signed)
Pt. Eating breakfast

## 2015-07-17 NOTE — ED Notes (Signed)
Doroteo Glassman, MD, This RN & Thayer Ohm, C. Rn at bedside discussing pt & pts wife concerns, pt to be discharged

## 2015-07-17 NOTE — ED Notes (Signed)
Spoke with patient and wife at bedside.  Indicated wanted medication for "nerves" chest pain pressure tightness increased from 1/10 to 3/10.  Paged and spoke to admitting Doctor and informed of patient request and stronger pain medication if needed.

## 2015-07-17 NOTE — ED Notes (Signed)
Admitting MD at bedside discussing plan of care with pt.

## 2015-07-17 NOTE — Discharge Instructions (Signed)
Ronald Chapman was admitted for very elevated blood pressures, termed a hypertensive emergency. His blood pressure responded well to a single dose of labetolol 10 mg, and no further antihypertensive medications were given. Because of blurry vision, a CT head was performed to rule out stroke. Ronald Chapman was also having chest pain, so a chest x-ray, heart enzymes and repeat EKGs were obtained. All of these tests were negative for any acute changes. Stroke and heart attack were ruled out by these tests. Because his blood pressure responded so well to the small dose of medicine he received, he will not be discharged on any antihypertensive medicine at this time to decrease his risk of blood pressure that are too low.  To reduce risk of stroke and heart attack, Ronald Chapman should take 81 mg of aspirin daily. Because his chest pain is sometimes associated with meals, he was prescribed protonix 40 mg daily on discharge to trial for symptom improvement. Given his symptoms, he should see his cardiologist this week for a cardiac stress test. He should follow-up with his PCP to investigate other risk factors for high blood pressure, including high cholesterol and high blood sugar. Decreasing alcohol intake may also decrease your risk of future hypertensive emergencies.   Please seek medical attention again if you have high blood pressure with severe headache, changes in vision, vomiting or nausea, chest pain, trouble breathing or severe anxiety. If you have chest pain with discomfort in one or both arms, the back, neck, jaw or stomach, sweating, nausea or vomiting, please seek medical attention.   Hypertension  There are two types of high blood pressure.  Primary (essential) hypertension  For most adults, there's no identifiable cause of high blood pressure. This type of high blood pressure, called primary (essential) hypertension, tends to develop gradually over many years.  Secondary hypertension  Some people  have high blood pressure caused by an underlying condition. This type of high blood pressure, called secondary hypertension, tends to appear suddenly and cause higher blood pressure than does primary hypertension. Various conditions and medications can lead to secondary hypertension, including:   Obstructive sleep apnea  Kidney problems  Adrenal gland tumors  Thyroid problems  Certain defects in blood vessels you're born with (congenital)  Certain medications, such as birth control pills, cold remedies, decongestants, over-the-counter pain relievers and some prescription drugs  Illegal drugs, such as cocaine and amphetamines  Alcohol abuse or chronic alcohol use   Hypertensive Emergency What is a high blood pressure emergency? -- A high blood pressure emergency is a serious - and even life-threatening - condition that can happen when a person's blood pressure gets much higher than normal. When a person's blood pressure gets very high, it can lead to problems in one or more of the following organs:  ?Eyes - Problems can include bleeding in the back of the eye, or swelling of the nerve that runs from the eye to the brain. ?Brain - Problems can include swelling or bleeding in the brain, or a stroke. A stroke is when part of the brain is injured because it goes without blood for too long. ?Kidneys - Very high blood pressure can lead to kidney failure, which is when the kidneys stop working. ?Heart - Heart problems can include a heart attack, heart failure, or damage to a major blood vessel. When your doctor or nurse tells you your blood pressure, he or she says 2 numbers. For example, your doctor might say that your blood pressure is "140  over 90." When people have a high blood pressure emergency, their blood pressure is usually "180 over 120" or higher.  Other terms doctors might use for a high blood pressure emergency are "hypertensive emergency" or "malignant hypertension."  Sometimes,  a person's blood pressure is much higher than normal, but it hasn't damaged any organs. Doctors call this "hypertensive urgency." Hypertensive urgency is not usually treated the same as a high blood pressure emergency.  What are the symptoms of a high blood pressure emergency? -- The symptoms depend on the organ or organs affected. They can include:  ?Blurry vision or other vision changes ?Headache ?Nausea or vomiting ?Confusion ?Passing out or seizures - Seizures are waves of abnormal electrical activity in the brain that can make people move or behave strangely. ?Weakness or numbness on one side of the body, or in one arm or leg ?Difficulty talking ?Trouble breathing ?Chest pain ?Pain in the upper back or between the shoulders ?Urine that is brown or bloody ?Pain in the lower back or on the side of the body Should I see a doctor or nurse? -- Yes. Call your doctor or nurse right away if you have any of the symptoms listed above, especially if you know that you have high blood pressure.  Will I need tests? -- Yes. Your doctor or nurse will ask about your symptoms, do an exam, and check your blood pressure. He or she might use a special light to look in the back of your eyes.  Your doctor will also do tests to check how serious your condition is. Tests can include:  ?Blood tests ?Urine tests ?A chest X-ray ?A CT scan or other imaging test of your brain - Imaging tests create pictures of the inside of the body. ?A CT scan or other imaging test of your chest ?An ECG (also called an "electrocardiogram" or "EKG") - This test measures the electrical activity in your heart (figure 1). How is a high blood pressure emergency treated? -- A high blood pressure emergency is treated in the hospital. Your doctor will give you medicines to lower your blood pressure quickly. These medicines usually go into your vein through a tube called an "IV."  Your doctor will also treat any problems caused by your  very high blood pressure, if they can be treated.  People who have a high blood pressure emergency usually need long-term treatment to keep their blood pressure under control. This usually includes:  ?Taking medicines ?Following a low-salt diet that includes a lot of fruits and vegetables ?Losing weight (if you are overweight) ?Getting regular exercise

## 2015-07-17 NOTE — ED Notes (Signed)
Report to Melissa B.,RN 

## 2015-07-17 NOTE — ED Notes (Signed)
Discharged home with wife per admitting doctors

## 2015-07-17 NOTE — ED Notes (Signed)
Pt to be seen by MD prior to transport to floor d/t CP, MD paged again re: pts EKG, MD to report to bedside

## 2015-07-18 ENCOUNTER — Telehealth: Payer: Self-pay | Admitting: Internal Medicine

## 2015-07-18 NOTE — Telephone Encounter (Signed)
Called Ronald Chapman to inform him of appointment for hospital follow-up with Dr. Rollene Rotunda with [facility]. Appointment is on 07/20/15 at 11:30 a.m. at [address] [place]. Patient took information and stated he could make appointment.

## 2015-07-19 ENCOUNTER — Telehealth: Payer: Self-pay | Admitting: Nurse Practitioner

## 2015-07-19 NOTE — Telephone Encounter (Signed)
-----   Message from Vesta Mixer, MD sent at 07/18/2015 10:43 AM EDT ----- Regarding: please work in patient for Monday 8/15 Ronald Chapman, Ronald Chapman  is a friend of Dr. Gustavo Lah.  He was seen in the ER with ? Syncopal episode.    He may have SVT. Also has had some HTN - BP was normal when he left the ER  Lets have him come in at 9:15 on Monday 8/15.  Please work  Him in  at the end of my 1/4 day .  Thanks  [employer]

## 2015-07-19 NOTE — Telephone Encounter (Signed)
Left message for patient to call office for appointment

## 2015-07-20 ENCOUNTER — Ambulatory Visit (INDEPENDENT_AMBULATORY_CARE_PROVIDER_SITE_OTHER): Payer: BLUE CROSS/BLUE SHIELD | Admitting: Cardiology

## 2015-07-20 ENCOUNTER — Encounter: Payer: Self-pay | Admitting: Cardiology

## 2015-07-20 VITALS — BP 142/92 | HR 73 | Ht 68.0 in | Wt 165.0 lb

## 2015-07-20 DIAGNOSIS — R0683 Snoring: Secondary | ICD-10-CM

## 2015-07-20 MED ORDER — METOPROLOL TARTRATE 25 MG PO TABS: ORAL_TABLET | ORAL | Status: DC

## 2015-07-20 NOTE — Progress Notes (Signed)
Cardiology Office Note   Date:  07/20/2015   ID:  Ronald Chapman, DOB 21-Mar-1966, MRN [id number]  PCP:  Cheral Bay, MD  Cardiologist:   Rollene Rotunda, MD   Chief Complaint  Patient presents with  . Hypertension      History of Present Illness: Ronald Chapman is a 49 y.o. male who presents for evaluation of hypertension. He was in the emergency room for this a few days ago. He has no prior history of hypertension.  However, he was sitting at a restaurant. He started to feel blurred vision and his heart racing. As this persisted emergency called and when they arrived he was found to have a blood pressure above 210. He came to the emergency room and I did review these records. He was hypertensive. He was slightly tachycardic. However, he responded quickly to IV labetalol. His blood pressure came down quickly and all his symptoms resolved. There was a mention of chest discomfort but he says he might of had some mild chest tightness when he was at his most anxious. He had never been told about blood pressure problems in the past. He's been physically active. He exercises routinely although he didn't over the last few months because he was very busy at work. For the past week he been able to exercise and had maybe a little less exercise tolerance but he was not having any overt symptoms. He could do his routine exercises recently without bringing on any chest discomfort, neck or arm discomfort. He was not having any palpitations, presyncope or syncope. He's had no PND or orthopnea.  Past Medical History  Diagnosis Date  . Trigeminal neuralgia     Past Surgical History  Procedure Laterality Date  . Nose surgery   age 24     Current Outpatient Prescriptions  Medication Sig Dispense Refill  . aspirin EC 81 MG EC tablet Take 1 tablet (81 mg total) by mouth daily. 30 tablet 0  . fexofenadine (ALLEGRA) 180 MG tablet Take 180 mg by mouth daily as needed for allergies or rhinitis.      . Oxcarbazepine (TRILEPTAL) 300 MG tablet Take 600 mg by mouth 3 (three) times daily.     . pantoprazole (PROTONIX) 40 MG tablet Take 1 tablet (40 mg total) by mouth daily. 30 tablet 0  . metoprolol tartrate (LOPRESSOR) 25 MG tablet One tablet twice daily as needed for palpitations 60 tablet 6   No current facility-administered medications for this visit.    Allergies:   Review of patient's allergies indicates no known allergies.    Social History:  The patient  reports that he has never smoked. He does not have any smokeless tobacco history on file. He reports that he drinks alcohol. He reports that he does not use illicit drugs.   Family History:  The patient's family history includes Heart disease (age of onset: 61) in his maternal grandfather.    ROS:  Please see the history of present illness.   Otherwise, review of systems are positive for snoring, daytime somnolence, headaches.   All other systems are reviewed and negative.    PHYSICAL EXAM: VS:  BP 142/92 mmHg  Pulse 73  Ht  (1.727 m)  Wt 165 lb (74.844 kg)  BMI 25.09 kg/m2 , BMI Body mass index is 25.09 kg/(m^2). GENERAL:  Well appearing HEENT:  Pupils equal round and reactive, fundi not visualized, oral mucosa unremarkable NECK:  No jugular venous distention, waveform within normal limits,  carotid upstroke brisk and symmetric, no bruits, no thyromegaly LYMPHATICS:  No cervical, inguinal adenopathy LUNGS:  Clear to auscultation bilaterally BACK:  No CVA tenderness CHEST:  Unremarkable HEART:  PMI not displaced or sustained,S1 and S2 within normal limits, no S3, no S4, no clicks, no rubs, no murmurs ABD:  Flat, positive bowel sounds normal in frequency in pitch, no bruits, no rebound, no guarding, no midline pulsatile mass, no hepatomegaly, no splenomegaly EXT:  2 plus pulses throughout, no edema, no cyanosis no clubbing SKIN:  No rashes no nodules NEURO:  Cranial nerves II through XII grossly intact, motor grossly  intact throughout PSYCH:  Cognitively intact, oriented to person place and time    EKG:  EKG is not ordered today. The ekg ordered 07/17/15 demonstrates sinus rhythm, rate 68, axis within normal limits, intervals within normal limits, no acute ST-T wave changes.   Recent Labs: 07/16/2015: ALT 25; Hemoglobin 15.3; Platelets 177 07/17/2015: BUN 11; Creatinine, Ser 1.00; Potassium 4.0; Sodium 133*    Lipid Panel No results found for: CHOL, TRIG, HDL, CHOLHDL, VLDL, LDLCALC, LDLDIRECT    Wt Readings from Last 3 Encounters:  07/20/15 165 lb (74.844 kg)  07/16/15 160 lb 0.9 oz (72.6 kg)  06/23/15 160 lb (72.576 kg)      Other studies Reviewed: Additional studies/ records that were reviewed today include: ED records. Review of the above records demonstrates:  Please see elsewhere in the note.     ASSESSMENT AND PLAN:  HTN:  The patient's blood pressure is mildly elevated today. However, blood pressure elevations have been unusual. He's going to keep a blood pressure diary. For now I'm not going to start a medication, unless he continues to have elevated pressures although we did talk about therapeutic lifestyle changes.  SLEEP APNEA:  The patient sounds like he has sleep apnea. This could contribute to blood pressure issues. Is going to be scheduled for a sleep study.   Current medicines are reviewed at length with the patient today.  The patient does not have concerns regarding medicines.  The following changes have been made:  no change  Labs/ tests ordered today include:   Orders Placed This Encounter  Procedures  . Split night study     Disposition:   FU with me in six weeks.     Signed, Rollene Rotunda, MD  07/20/2015 1:33 PM    [facility]

## 2015-07-20 NOTE — Patient Instructions (Signed)
Your physician recommends that you schedule a follow-up appointment in: 6 WEEKS WITH DR [facility]  TAKE METOPROLOL TARTRATE 25 MG TWICE DAILY AS NEEDED FOR PALPITATIONS  Your physician has recommended that you have a sleep study. This test records several body functions during sleep, including: brain activity, eye movement, oxygen and carbon dioxide blood levels, heart rate and rhythm, breathing rate and rhythm, the flow of air through your mouth and nose, snoring, body muscle movements, and chest and belly movement.

## 2015-09-07 ENCOUNTER — Ambulatory Visit (INDEPENDENT_AMBULATORY_CARE_PROVIDER_SITE_OTHER): Payer: BLUE CROSS/BLUE SHIELD | Admitting: Cardiology

## 2015-09-07 VITALS — BP 138/72 | HR 60 | Ht 68.0 in | Wt 166.3 lb

## 2015-09-07 DIAGNOSIS — I10 Essential (primary) hypertension: Secondary | ICD-10-CM | POA: Diagnosis not present

## 2015-09-07 MED ORDER — PANTOPRAZOLE SODIUM 40 MG PO TBEC: 40.0000 mg | DELAYED_RELEASE_TABLET | Freq: Every day | ORAL | Status: DC | PRN

## 2015-09-07 NOTE — Progress Notes (Signed)
Cardiology Office Note   Date:  09/07/2015   ID:  Ronald Chapman, DOB 1966-07-27, MRN [id number]  PCP:  Cheral Bay, MD  Cardiologist:   Rollene Rotunda, MD   Chief Complaint  Patient presents with  . Follow-up    six weeks      History of Present Illness: Ronald Chapman is a 49 y.o. male who presents for evaluation of hypertension.  This event as outlined in previous note. I brought him back today to see in her symptoms. Has not had any palpitations. He's had no episodes such as the rapid heart rate that he had or elevated blood pressure. He's not needed to take any beta blockers. He does have a sleep apnea study scheduled. He's been exercising and has tolerated this without symptoms.  Past Medical History  Diagnosis Date  . Trigeminal neuralgia     Past Surgical History  Procedure Laterality Date  . Nose surgery   age 22     Current Outpatient Prescriptions  Medication Sig Dispense Refill  . aspirin EC 81 MG EC tablet Take 1 tablet (81 mg total) by mouth daily. 30 tablet 0  . fexofenadine (ALLEGRA) 180 MG tablet Take 180 mg by mouth daily as needed for allergies or rhinitis.    . metoprolol tartrate (LOPRESSOR) 25 MG tablet One tablet twice daily as needed for palpitations 60 tablet 6  . Oxcarbazepine (TRILEPTAL) 300 MG tablet Take 600 mg by mouth 3 (three) times daily.     . pantoprazole (PROTONIX) 40 MG tablet Take 1 tablet (40 mg total) by mouth daily. 30 tablet 0   No current facility-administered medications for this visit.    Allergies:   Review of patient's allergies indicates no known allergies.    ROS:  Please see the history of present illness.   Otherwise, review of systems are positive for snoring, daytime somnolence, headaches.   All other systems are reviewed and negative.    PHYSICAL EXAM: VS:  BP 138/72 mmHg  Pulse 60  Ht  (1.727 m)  Wt 166 lb 4.8 oz (75.433 kg)  BMI 25.29 kg/m2 , BMI Body mass index is 25.29 kg/(m^2). GENERAL:   Well appearing NECK:  No jugular venous distention, waveform within normal limits, carotid upstroke brisk and symmetric, no bruits, no thyromegaly LUNGS:  Clear to auscultation bilaterally CHEST:  Unremarkable HEART:  PMI not displaced or sustained,S1 and S2 within normal limits, no S3, no S4, no clicks, no rubs, no murmurs ABD:  Flat, positive bowel sounds normal in frequency in pitch, no bruits, no rebound, no guarding, no midline pulsatile mass, no hepatomegaly, no splenomegaly EXT:  2 plus pulses throughout, no edema, no cyanosis no clubbing    EKG:  EKG is not ordered today. The ekg ordered 07/17/15 demonstrates sinus rhythm, rate 68, axis within normal limits, intervals within normal limits, no acute ST-T wave changes.   Recent Labs: 07/16/2015: ALT 25; Hemoglobin 15.3; Platelets 177 07/17/2015: BUN 11; Creatinine, Ser 1.00; Potassium 4.0; Sodium 133*    Lipid Panel No results found for: CHOL, TRIG, HDL, CHOLHDL, VLDL, LDLCALC, LDLDIRECT    Wt Readings from Last 3 Encounters:  09/07/15 166 lb 4.8 oz (75.433 kg)  07/20/15 165 lb (74.844 kg)  07/16/15 160 lb 0.9 oz (72.6 kg)      Other studies Reviewed: Additional studies/ records that were reviewed today include:None    ASSESSMENT AND PLAN:  HTN:  The blood pressure is at target. No change in  medications is indicated. We will continue with therapeutic lifestyle changes (TLC).  SLEEP APNEA:  The patient sounds like he has sleep apnea.  Sleep study is pending.     Current medicines are reviewed at length with the patient today.  The patient does not have concerns regarding medicines.  The following changes have been made:  no change  Labs/ tests ordered today include:  None   Disposition:   FU with me as needed  Signed, Rollene Rotunda, MD  09/07/2015 3:46 PM    [facility]

## 2015-09-07 NOTE — Patient Instructions (Signed)
Your physician recommends that you schedule a follow-up appointment in: AS NEEDED  

## 2015-09-08 ENCOUNTER — Encounter: Payer: Self-pay | Admitting: Cardiology

## 2015-09-21 ENCOUNTER — Encounter (HOSPITAL_BASED_OUTPATIENT_CLINIC_OR_DEPARTMENT_OTHER): Payer: BLUE CROSS/BLUE SHIELD

## 2015-10-08 ENCOUNTER — Ambulatory Visit (HOSPITAL_BASED_OUTPATIENT_CLINIC_OR_DEPARTMENT_OTHER): Payer: BLUE CROSS/BLUE SHIELD | Attending: Cardiology

## 2015-10-08 VITALS — Ht 68.0 in | Wt 165.0 lb

## 2015-10-08 DIAGNOSIS — G4733 Obstructive sleep apnea (adult) (pediatric): Secondary | ICD-10-CM | POA: Insufficient documentation

## 2015-10-08 DIAGNOSIS — R0683 Snoring: Secondary | ICD-10-CM

## 2015-10-08 DIAGNOSIS — E139 Other specified diabetes mellitus without complications: Secondary | ICD-10-CM | POA: Diagnosis not present

## 2015-10-08 DIAGNOSIS — R5383 Other fatigue: Secondary | ICD-10-CM | POA: Insufficient documentation

## 2015-10-08 DIAGNOSIS — G4730 Sleep apnea, unspecified: Secondary | ICD-10-CM | POA: Diagnosis present

## 2015-10-26 ENCOUNTER — Telehealth: Payer: Self-pay

## 2015-10-26 NOTE — Telephone Encounter (Signed)
Pt walked in office today asking about his sleep study results.  Left message that they are still processing and we will call him as soon as we have the results.

## 2015-10-29 NOTE — Sleep Study (Signed)
    Patient Name: Ronald Chapman, Ronald Chapman Study Date: 10/08/2015 Gender: Male D.O.B: 1966-09-21 Age (years): 48 Referring Provider: Rollene RotundaJames Hochrein Height (inches): 68 Interpreting Physician: Nicki Guadalajarahomas Roshonda Sperl MD, ABSM Weight (lbs): 165 RPSGT: Cherylann ParrDubili, Fred BMI: 25 MRN: [id number] Neck Size: 15.00  CLINICAL INFORMATION Sleep Study Type: NPSG Indication for sleep study: Fatigue, Snoring, Witnessed Apneas Epworth Sleepiness Score: 10  SLEEP STUDY TECHNIQUE As per the AASM Manual for the Scoring of Sleep and Associated Events v2.3 (April 2016) with a hypopnea requiring 4% desaturations. The channels recorded and monitored were frontal, central and occipital EEG, electrooculogram (EOG), submentalis EMG (chin), nasal and oral airflow, thoracic and abdominal wall motion, anterior tibialis EMG, snore microphone, electrocardiogram, and pulse oximetry.  MEDICATIONS  fexofenadine (ALLEGRA) 180 MG tablet 180 mg, Daily PRN     metoprolol tartrate (LOPRESSOR) 25 MG tablet      Oxcarbazepine (TRILEPTAL) 300 MG tablet 600 mg, 3 times daily     pantoprazole (PROTONIX) 40 MG tablet   Medications self-administered by patient during sleep study : No sleep medicine administered.  SLEEP ARCHITECTURE The study was initiated at 10:12:33 PM and ended at 5:21:33 AM. Sleep onset time was 23.2 minutes and the sleep efficiency was 92.4%. The total sleep time was 396.5 minutes.  Wake after sleep onset was 9.3 minutes. Stage REM latency was 104.5 minutes. The patient spent 1.64% of the night in stage N1 sleep, 56.75% in stage N2 sleep, 24.34% in stage N3 and 17.28% in REM. Alpha intrusion was absent. Supine sleep was 96.54%.  RESPIRATORY PARAMETERS The overall apnea/hypopnea index (AHI) was 21.9 per hour. There were 54 total apneas, including 53 obstructive, 1 central and 0 mixed apneas. There were 91 hypopneas and 14 RERAs. The AHI during Stage REM sleep was 37.7 per hour. AHI while supine was 22.7 per  hour. The mean oxygen saturation was 95.73%. The minimum SpO2 during sleep was 85.00%. Loud snoring was noted during this study.  CARDIAC DATA The 2 lead EKG demonstrated sinus rhythm. The mean heart rate was 57.48 beats per minute. Other EKG findings include: None.  LEG MOVEMENT DATA The total PLMS were 17 with a resulting PLMS index of 2.57. Associated arousal with leg movement index was 0.0 .  IMPRESSIONS - Moderate obstructive sleep apnea/hypopnea syndrome overall (AHI 21.9/h) with severe sleep apnea during REM sleep (AHI 37.7/h) - No significant central sleep apnea occurred during this study (CAI = 0.2/h). - Mild oxygen desaturation to a nadir of 85.00%. - Loud snoring. - No cardiac abnormalities were noted during this study. - Clinically significant periodic limb movements did not occur during sleep. No significant associated arousals.  DIAGNOSIS - Obstructive Sleep Apnea (327.23 [G47.33 ICD-10])  RECOMMENDATIONS - Recommend CPAP titration to determine optimal pressure required to alleviate sleep disordered breathing. - Effort should be made to optimize nasal and oral pharyngeal patency - Positional therapy avoiding supine position during sleep. - Avoid alcohol, sedatives and other CNS depressants that may worsen sleep apnea and disrupt normal sleep architecture. - Sleep hygiene should be reviewed to assess factors that may improve sleep quality.  Lennette BihariKELLY,Genni Buske A Diplomate, American Board of Sleep Medicine  ELECTRONICALLY SIGNED ON:  10/29/2015, 2:19 PM  [facility] PH: [phone]   FX: [phone] ACCREDITED BY THE AMERICAN ACADEMY OF SLEEP MEDICINE

## 2015-11-15 ENCOUNTER — Telehealth: Payer: Self-pay | Admitting: *Deleted

## 2015-11-15 DIAGNOSIS — G4733 Obstructive sleep apnea (adult) (pediatric): Secondary | ICD-10-CM

## 2015-11-15 NOTE — Telephone Encounter (Signed)
-----   Message from Lennette Biharihomas A Kelly, MD sent at 10/29/2015  2:25 PM EST ----- Burna MortimerWanda please set up for CPAP titration study at the earliest date

## 2015-11-15 NOTE — Telephone Encounter (Signed)
Left detailed message on cell #. Sleep study positive for sleep apnea. Will need to have titration study.

## 2016-11-23 ENCOUNTER — Emergency Department (HOSPITAL_BASED_OUTPATIENT_CLINIC_OR_DEPARTMENT_OTHER): Payer: BLUE CROSS/BLUE SHIELD

## 2016-11-23 ENCOUNTER — Encounter (HOSPITAL_BASED_OUTPATIENT_CLINIC_OR_DEPARTMENT_OTHER): Payer: Self-pay | Admitting: Emergency Medicine

## 2016-11-23 ENCOUNTER — Emergency Department (HOSPITAL_BASED_OUTPATIENT_CLINIC_OR_DEPARTMENT_OTHER)
Admission: EM | Admit: 2016-11-23 | Discharge: 2016-11-24 | Disposition: A | Discharge status: Home / Self Care | Payer: BLUE CROSS/BLUE SHIELD | Attending: Emergency Medicine | Admitting: Emergency Medicine

## 2016-11-23 DIAGNOSIS — R300 Dysuria: Secondary | ICD-10-CM | POA: Diagnosis not present

## 2016-11-23 DIAGNOSIS — R1031 Right lower quadrant pain: Secondary | ICD-10-CM | POA: Diagnosis not present

## 2016-11-23 DIAGNOSIS — R109 Unspecified abdominal pain: Secondary | ICD-10-CM

## 2016-11-23 LAB — URINALYSIS, ROUTINE W REFLEX MICROSCOPIC
BILIRUBIN URINE: NEGATIVE
Glucose, UA: NEGATIVE mg/dL
Hgb urine dipstick: NEGATIVE
KETONES UR: NEGATIVE mg/dL
Leukocytes, UA: NEGATIVE
NITRITE: NEGATIVE
PH: 7 (ref 5.0–8.0)
Protein, ur: NEGATIVE mg/dL
SPECIFIC GRAVITY, URINE: 1.02 (ref 1.005–1.030)

## 2016-11-23 LAB — CBC WITH DIFFERENTIAL/PLATELET
BASOS PCT: 0 %
Basophils Absolute: 0 10*3/uL (ref 0.0–0.1)
EOS ABS: 0.1 10*3/uL (ref 0.0–0.7)
Eosinophils Relative: 1 %
HEMATOCRIT: 39.4 % (ref 39.0–52.0)
Hemoglobin: 13.9 g/dL (ref 13.0–17.0)
Lymphocytes Relative: 29 %
Lymphs Abs: 1.5 10*3/uL (ref 0.7–4.0)
MCH: 33.3 pg (ref 26.0–34.0)
MCHC: 35.3 g/dL (ref 30.0–36.0)
MCV: 94.3 fL (ref 78.0–100.0)
Monocytes Absolute: 0.6 10*3/uL (ref 0.1–1.0)
Monocytes Relative: 11 %
Neutro Abs: 3.1 10*3/uL (ref 1.7–7.7)
Neutrophils Relative %: 59 %
Platelets: 168 10*3/uL (ref 150–400)
RBC: 4.18 MIL/uL — AB (ref 4.22–5.81)
RDW: 11.6 % (ref 11.5–15.5)
WBC: 5.3 10*3/uL (ref 4.0–10.5)

## 2016-11-23 LAB — BASIC METABOLIC PANEL
ANION GAP: 6 (ref 5–15)
BUN: 17 mg/dL (ref 6–20)
CHLORIDE: 97 mmol/L — AB (ref 101–111)
CO2: 30 mmol/L (ref 22–32)
Calcium: 9 mg/dL (ref 8.9–10.3)
Creatinine, Ser: 0.89 mg/dL (ref 0.61–1.24)
GFR calc Af Amer: >60 mL/min (ref >60)
GFR calc non Af Amer: >60 mL/min (ref >60)
Glucose, Bld: 111 mg/dL — ABNORMAL HIGH (ref 65–99)
POTASSIUM: 3.5 mmol/L (ref 3.5–5.1)
SODIUM: 133 mmol/L — AB (ref 135–145)

## 2016-11-23 MED ORDER — IOPAMIDOL (ISOVUE-300) INJECTION 61%
100.0000 mL | Freq: Once | INTRAVENOUS | Status: AC | PRN
  Administered 2016-11-23: 100 mL via INTRAVENOUS

## 2016-11-23 NOTE — ED Notes (Signed)
Patient transported to CT 

## 2016-11-23 NOTE — ED Notes (Signed)
States has had increasing pain to RLQ x 10 days, comes and goes , and hesitancy with urination, scheduled  for hernia repair to left groin on Friday, denies n/v

## 2016-11-23 NOTE — ED Triage Notes (Signed)
patient reports that he has had abdominal pain on the right and left groin. The patient reports that he was dx with bilateral inguinal hernias - the patient reports that he is scheduled for surgery this Friday. The patient reports that the pain is getting worse.

## 2016-11-23 NOTE — ED Notes (Signed)
Family at bedside. 

## 2016-11-23 NOTE — ED Provider Notes (Signed)
[facility] Provider Note   CSN: [id number] Arrival date & time: 11/23/16  2041  By signing my name below, I, Alyssa GroveMartin Green, attest that this documentation has been prepared under the direction and in the presence of Sharilyn SitesLisa Sanders, PA-C. Electronically Signed: Alyssa GroveMartin Green, ED Scribe. 11/23/16. 10:35 PM.  History   Chief Complaint Chief Complaint  Patient presents with  . Abdominal Pain   The history is provided by the patient. No language interpreter was used.   HPI Comments: Ronald Chapman is a 50 y.o. male who presents to the Emergency Department complaining of gradual onset and worsening, constant moderate right lower and suprapubic abdominal pain onset a few days. No exacerbating or alleviating factors noted. Pain is gradually worsening, especially to the right lower quadrant. Pt reports the pain is a constant and pressure-like with occasional episodes of sharp, stabbing pains worse in the right lower quadrant. Pt reports associated difficulty urinating and states he has difficulty with starting urination. He currently has bilateral inguinal hernias [facility] Rubye OaksPalmer. He denies nausea, vomiting, hematuria, fever.  No prior abdominal surgeries.  Past Medical History:  Diagnosis Date  . Trigeminal neuralgia     Patient Active Problem List   Diagnosis Date Noted  . Pain in the chest   . Trigeminal neuralgia   . Elevated blood sugar   . Hypertensive emergency 07/16/2015    Past Surgical History:  Procedure Laterality Date  . NOSE SURGERY   age 50    Home Medications    Prior to Admission medications   Medication Sig Start Date End Date Taking? Authorizing Provider  fexofenadine (ALLEGRA) 180 MG tablet Take 180 mg by mouth daily as needed for allergies or rhinitis.    Historical Provider, MD  metoprolol tartrate (LOPRESSOR) 25 MG tablet One tablet twice daily as needed for palpitations 07/20/15   Rollene RotundaJames Hochrein,  MD  Oxcarbazepine (TRILEPTAL) 300 MG tablet Take 600 mg by mouth 3 (three) times daily.     Historical Provider, MD  pantoprazole (PROTONIX) 40 MG tablet Take 1 tablet (40 mg total) by mouth daily as needed. 09/07/15   Rollene RotundaJames Hochrein, MD    Family History Family History  Problem Relation Age of Onset  . Heart disease Maternal Grandfather 7972    Social History Social History  Substance Use Topics  . Smoking status: Never Smoker  . Smokeless tobacco: Never Used  . Alcohol use Yes     Comment: occasional     Allergies   Patient has no known allergies.   Review of Systems Review of Systems  Constitutional: Negative for fever.  Gastrointestinal: Positive for abdominal pain. Negative for nausea and vomiting.  Genitourinary: Positive for difficulty urinating. Negative for hematuria.  All other systems reviewed and are negative.  Physical Exam Updated Vital Signs BP 153/98 (BP Location: Right Arm)   Pulse 75   Temp 98.1 F (36.7 C) (Oral)   Resp 18   Ht 5\' 8"  (1.727 m)   Wt 155 lb (70.3 kg)   SpO2 100%   BMI 23.57 kg/m   Physical Exam  Constitutional: He is oriented to person, place, and time. He appears well-developed and well-nourished.  HENT:  Head: Normocephalic and atraumatic.  Mouth/Throat: Oropharynx is clear and moist.  Eyes: Conjunctivae and EOM are normal. Pupils are equal, round, and reactive to light.  Neck: Normal range of motion.  Cardiovascular: Normal rate, regular rhythm and normal heart sounds.  Pulmonary/Chest: Effort normal and breath sounds normal.  Abdominal: Soft. Bowel sounds are normal. There is tenderness in the right lower quadrant and suprapubic area. There is no CVA tenderness.  Musculoskeletal: Normal range of motion.  Neurological: He is alert and oriented to person, place, and time.  Skin: Skin is warm and dry.  Psychiatric: He has a normal mood and affect.  Nursing note and vitals reviewed.  ED Treatments / Results  DIAGNOSTIC  STUDIES: Oxygen Saturation is 100% on RA, normal by my interpretation.    COORDINATION OF CARE: 10:20 PM Discussed treatment plan with pt at bedside which includes CT Abdomen Pelvis with Contrast and pt agreed to plan.  Labs (all labs ordered are listed, but only abnormal results are displayed) Labs Reviewed  CBC WITH DIFFERENTIAL/PLATELET - Abnormal; Notable for the following:       Result Value   RBC 4.18 (*)    All other components within normal limits  BASIC METABOLIC PANEL - Abnormal; Notable for the following:    Sodium 133 (*)    Chloride 97 (*)    Glucose, Bld 111 (*)    All other components within normal limits  URINALYSIS, ROUTINE W REFLEX MICROSCOPIC    EKG  EKG Interpretation None       Radiology Ct Abdomen Pelvis W Contrast  Result Date: 11/24/2016 CLINICAL DATA:  Abdominal pain on the right and in the left groin. Known EXAM: CT ABDOMEN AND PELVIS WITH CONTRAST TECHNIQUE: Multidetector CT imaging of the abdomen and pelvis was performed using the standard protocol following bolus administration of intravenous contrast. CONTRAST:  100mL ISOVUE-300 IOPAMIDOL (ISOVUE-300) INJECTION 61% COMPARISON:  None. FINDINGS: Lower chest: No acute abnormality. Hepatobiliary: There is a probable cyst in the right hepatic lobe on series 2, image 30. No suspicious hepatic masses. Portal veins are normal. The gallbladder is poorly distended but unremarkable. Pancreas: Unremarkable. No pancreatic ductal dilatation or surrounding inflammatory changes. Spleen: Normal in size without focal abnormality. Adrenals/Urinary Tract: Adrenal glands are unremarkable. Kidneys are normal, without renal calculi, focal lesion, or hydronephrosis. Bladder is unremarkable. Stomach/Bowel: Stomach and small bowel are normal. The colon is normal. The appendix is borderline in caliber but there is no adjacent stranding to suggest appendicitis. Vascular/Lymphatic: No significant vascular findings are present. No  enlarged abdominal or pelvic lymph nodes. Reproductive: Prostate is unremarkable. Other: There is probably a small fat containing left inguinal hernia. The reported right inguinal hernia is not well seen on this study. Musculoskeletal: No acute or significant osseous findings. IMPRESSION: 1. No cause for acute pain identified. The appendix is borderline in caliber but there is no adjacent stranding to suggest appendicitis. 2. Probable small left inguinal hernia. The reported right inguinal hernia is not well seen on this study. Electronically Signed   By: Gerome Samavid  Williams III M.D   On: 11/24/2016 00:52    Procedures Procedures (including critical care time)  Medications Ordered in ED Medications - No data to display   Initial Impression / Assessment and Plan / ED Course  I have reviewed the triage vital signs and the nursing notes.  Pertinent labs & imaging results that were available during my care of the patient were reviewed by me and considered in my medical decision making (see chart for details).  Clinical Course    50 year old male here with right lower abdominal pain, worsening over the past 10 days. He has known hernias that are scheduled for surgical repair on 11/29/2016. He is afebrile and nontoxic. There is  some tenderness noted in the right lower quadrant and suprapubic region. No rebound or guarding. No peritonitis. Screening lab work is reassuring. UA without any noted blood or signs of infection. CT scan was obtained given patient's history of hernias to assess for any acute complications. CT today with borderline caliber size appendix, however no acute inflammatory changes to suggest acute appendicitis. Left inguinal hernia is noted without signs of incarceration. Results discussed with patient and wife, they acknowledged understanding. Patient has previously scheduled follow-up with his surgeon this week.  Discussed plan with patient, he acknowledged understanding and agreed with  plan of care.  Return precautions given for new or worsening symptoms.  Final Clinical Impressions(s) / ED Diagnoses   Final diagnoses:  Abdominal pain, unspecified abdominal location    New Prescriptions New Prescriptions   OXYCODONE-ACETAMINOPHEN (PERCOCET/ROXICET) 5-325 MG TABLET    Take 1 tablet by mouth every 4 (four) hours as needed.   I personally performed the services described in this documentation, which was scribed in my presence. The recorded information has been reviewed and is accurate.   Garlon Hatchet, PA-C 11/24/16 0102    Vanetta Mulders, MD 11/24/16 2329

## 2016-11-24 MED ORDER — OXYCODONE-ACETAMINOPHEN 5-325 MG PO TABS: 1.0000 | ORAL_TABLET | ORAL | 0 refills | Status: DC | PRN

## 2016-11-24 NOTE — ED Notes (Signed)
Pt and SO given d/c instructions as per chart. Rx x 1 with precautions. Verbalizes understanding. No questions. 

## 2016-11-24 NOTE — Discharge Instructions (Signed)
As we discussed, your appendix looked slightly larger than normal on CT, however no acute findings to suggest appendicitis. Take the prescribed medication as directed. Follow-up with Dr. Rubye OaksPalmer if symptoms not improving in the next few days. Return to the ED for new or worsening symptoms.

## 2017-01-22 IMAGING — CR DG ELBOW COMPLETE 3+V*L*
4 series · 4 of 4 positions shown · non-contrast
Comparison: None.

CLINICAL DATA: Injured 4 weeks ago 0 after slipping and falling
backwards, striking the elbow on an automobile.

EXAM:
LEFT ELBOW - COMPLETE 3+ VIEW

[x elbow joint lat left]
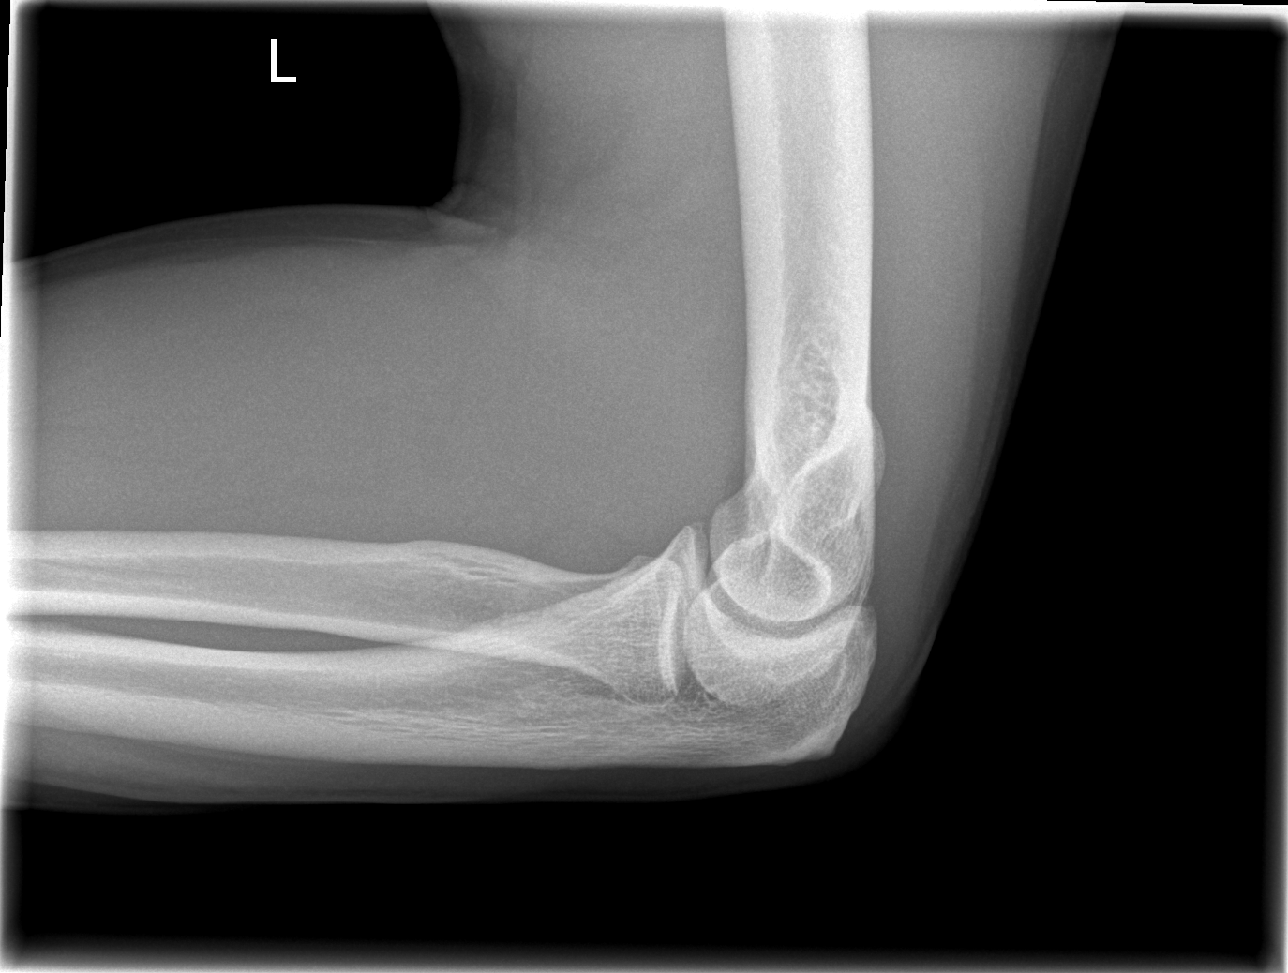

[x elbow joint ap left]
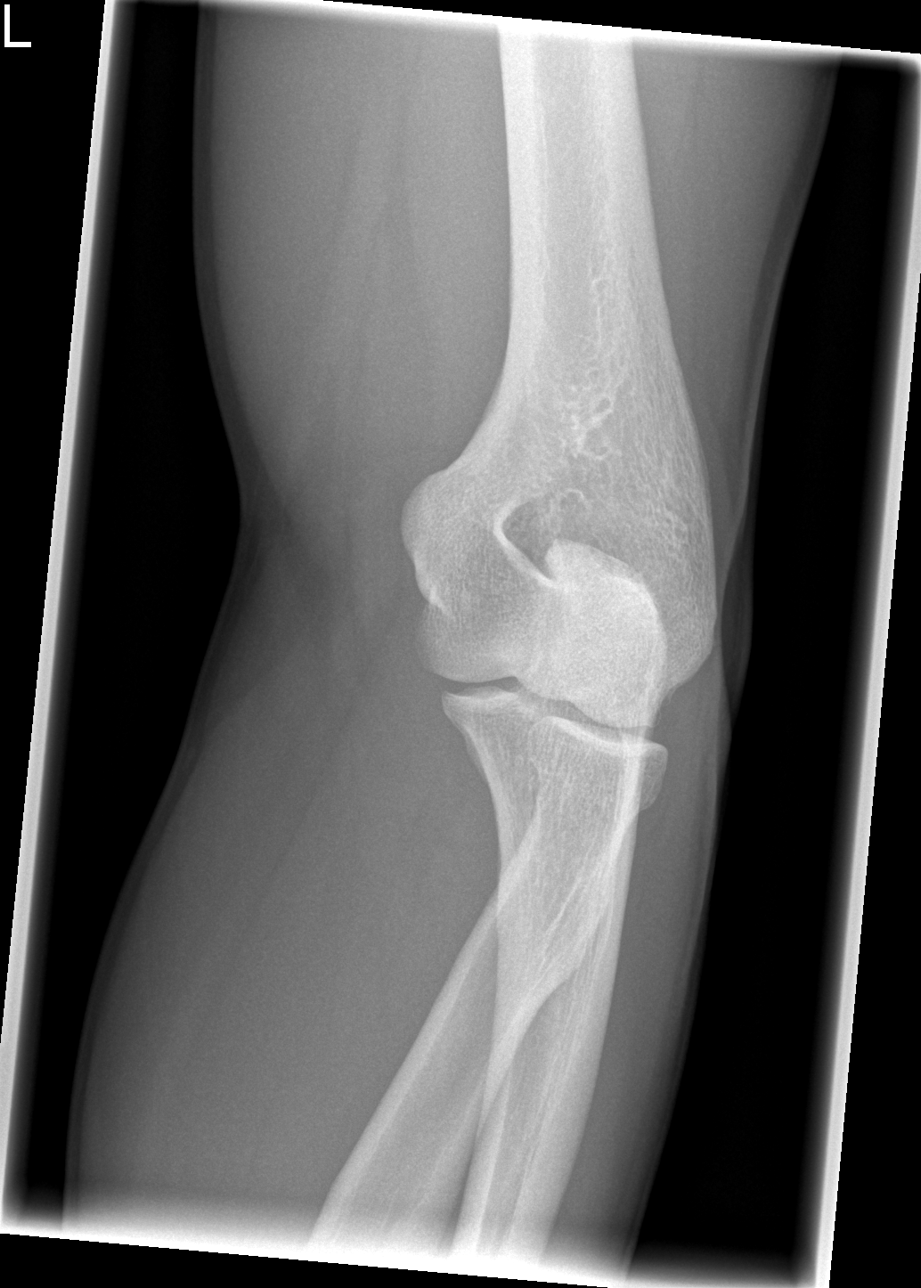

[x elbow joint obl. left (1 of 2)]
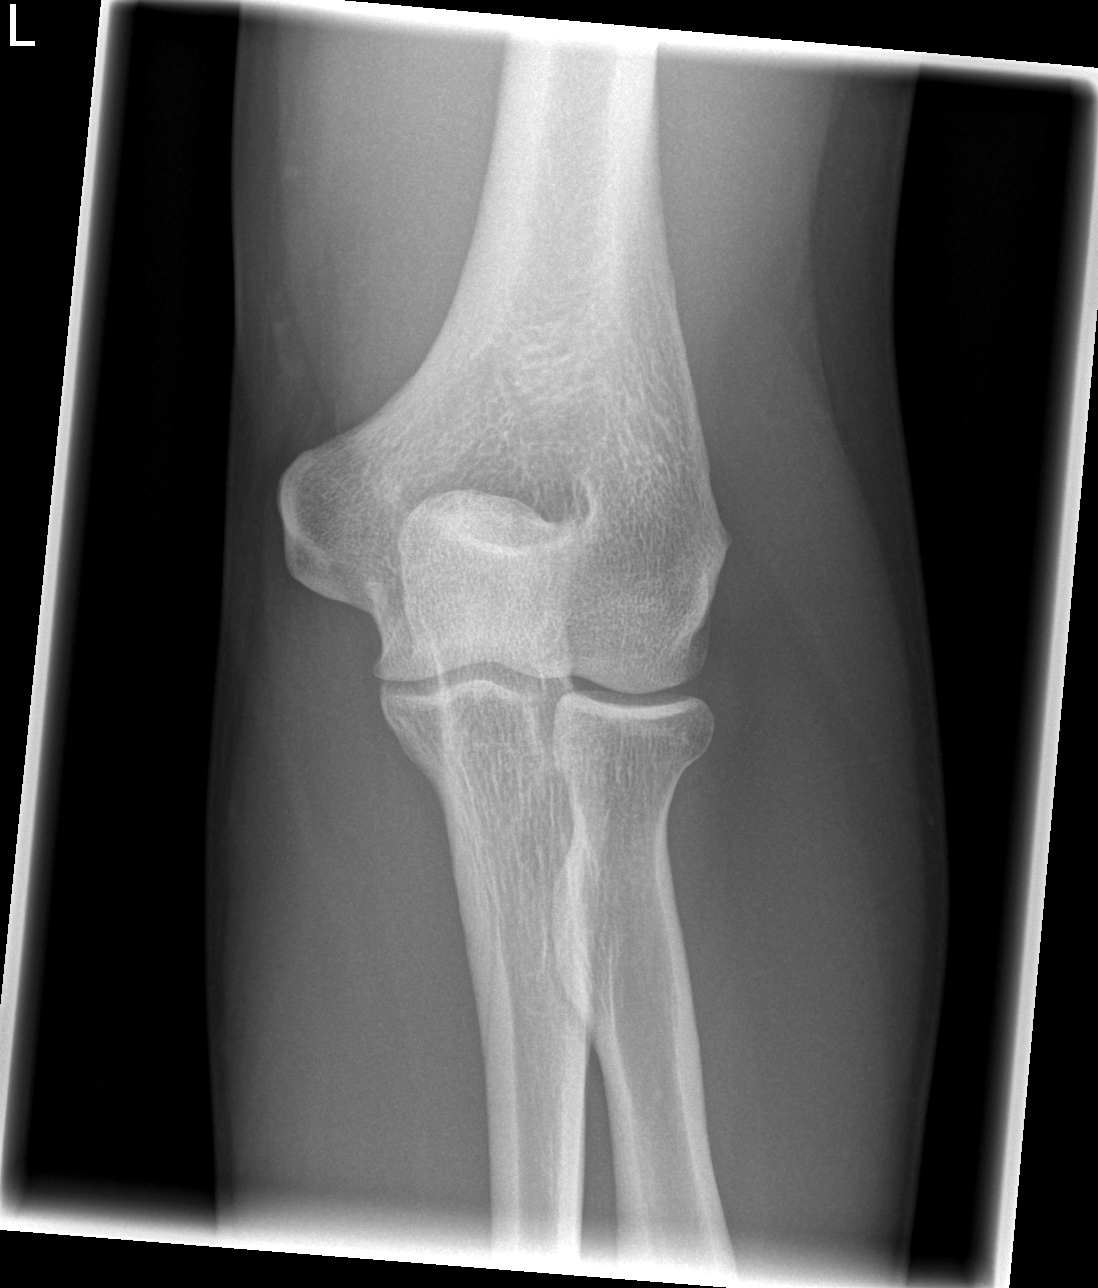

[x elbow joint obl. left (2 of 2)]
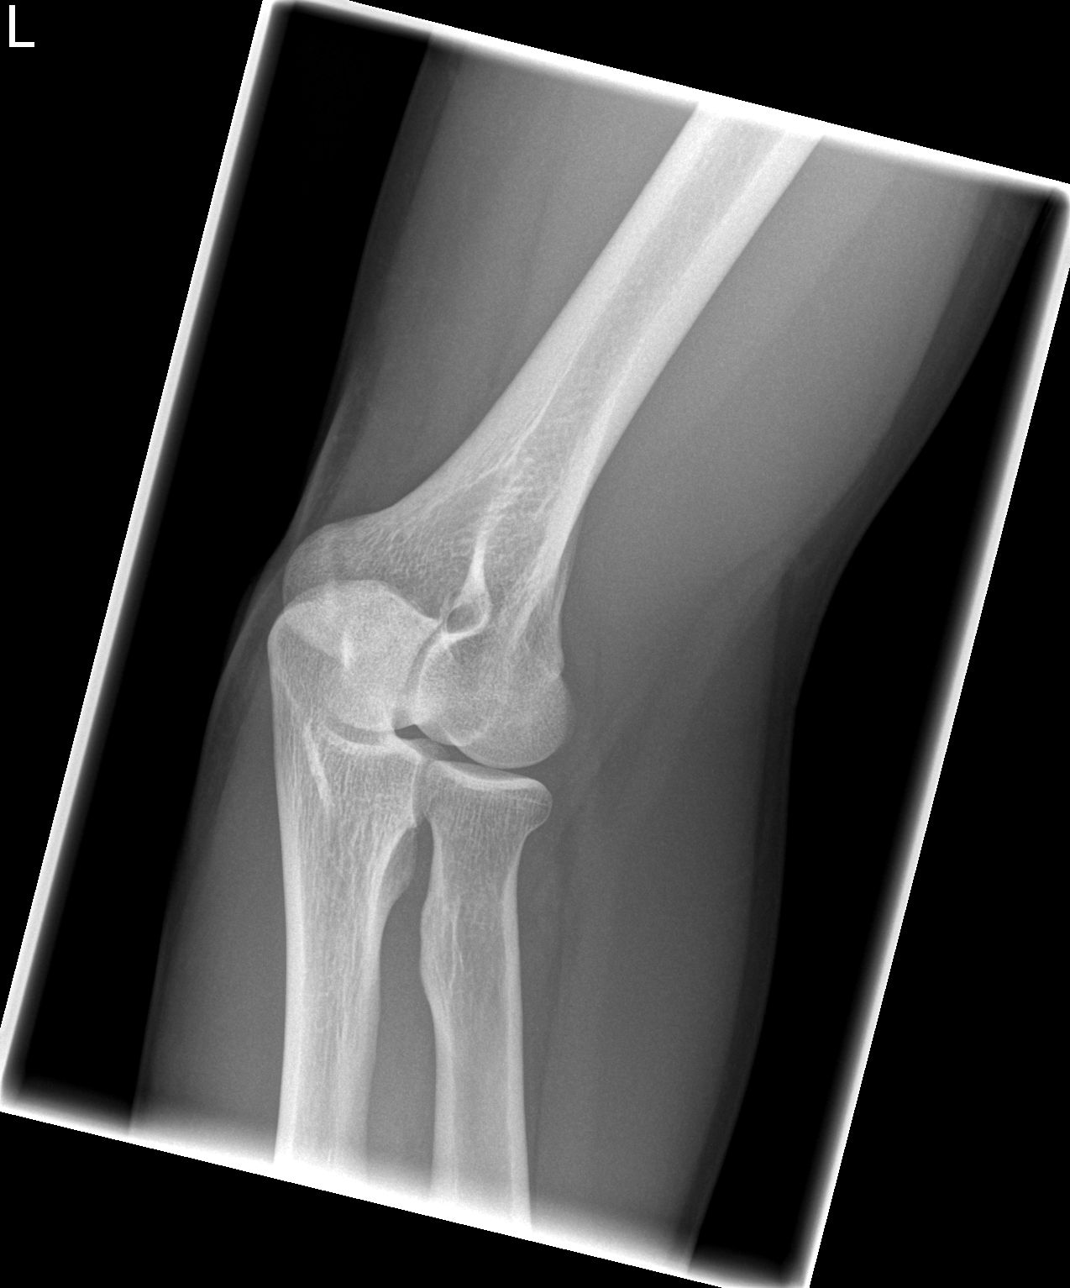

[4 of 4 positions shown; findings below may reference images not displayed]

FINDINGS: There is no evidence of fracture, dislocation, or joint effusion.
There is no evidence of arthropathy or other focal bone abnormality.
Soft tissues are unremarkable.
IMPRESSION: Normal

## 2018-02-20 DIAGNOSIS — Z8719 Personal history of other diseases of the digestive system: Secondary | ICD-10-CM | POA: Insufficient documentation

## 2018-02-20 DIAGNOSIS — G4730 Sleep apnea, unspecified: Secondary | ICD-10-CM | POA: Insufficient documentation

## 2018-12-09 HISTORY — PX: COLONOSCOPY: SHX174

## 2019-12-13 ENCOUNTER — Telehealth: Payer: Self-pay | Admitting: Nurse Practitioner

## 2019-12-13 MED ORDER — ASPIRIN EC 81 MG PO TBEC: 81.0000 mg | DELAYED_RELEASE_TABLET | Freq: Every day | ORAL | Status: DC

## 2019-12-13 MED ORDER — NITROGLYCERIN 0.4 MG SL SUBL: 0.4000 mg | SUBLINGUAL_TABLET | SUBLINGUAL | 3 refills | Status: DC | PRN

## 2019-12-13 NOTE — Telephone Encounter (Signed)
Received call from patient's mother-in-law who is a patient of Dr. Harvie Bridge with request for an appointment for patient. She connected me to the patient via telephone and I scheduled him to see Dr. Elease Hashimoto on Thursday 1/7. Patient complains of chest pressure/discomfort that sometimes radiates down left arm. He has pain with exercise and also has pain sometimes at rest. He reports symptoms started greater than 1 month ago. I advised him not to exercise prior to his appointment and advised that per Dr. Elease Hashimoto he should start aspirin 81 mg daily and use NTG 0.4 mg SL as needed for chest discomfort. I gave him instructions on using NTG and advised him that if symptoms worsen prior to appointment he should seek emergency medical attention. He verbalized understanding and agreement and thanked me for the call.

## 2019-12-16 ENCOUNTER — Encounter: Payer: Self-pay | Admitting: Cardiovascular Disease

## 2019-12-16 ENCOUNTER — Ambulatory Visit (INDEPENDENT_AMBULATORY_CARE_PROVIDER_SITE_OTHER): Payer: 59 | Admitting: Cardiovascular Disease

## 2019-12-16 ENCOUNTER — Other Ambulatory Visit: Payer: Self-pay

## 2019-12-16 VITALS — BP 152/80 | HR 61 | Ht 68.0 in | Wt 162.0 lb

## 2019-12-16 DIAGNOSIS — R079 Chest pain, unspecified: Secondary | ICD-10-CM | POA: Diagnosis not present

## 2019-12-16 DIAGNOSIS — R0789 Other chest pain: Secondary | ICD-10-CM

## 2019-12-16 MED ORDER — OMEPRAZOLE 20 MG PO TBDD: 20.0000 mg | DELAYED_RELEASE_TABLET | ORAL | 11 refills | Status: DC

## 2019-12-16 NOTE — Patient Instructions (Signed)
Medication Instructions:  Your physician has recommended you make the following change in your medication:  START Omeprazole (Pepcid) 20 mg once every morning 20 minutes before breakfast Take every day for 2 weeks then decrease to every other day or less until you are taking only as needed  *If you need a refill on your cardiac medications before your next appointment, please call your pharmacy*  Lab Work: None Ordered If you have labs (blood work) drawn today and your tests are completely normal, you will receive your results only by: Marland Kitchen MyChart Message (if you have MyChart) OR . A paper copy in the mail If you have any lab test that is abnormal or we need to change your treatment, we will call you to review the results.  Testing/Procedures: Your physician has requested that you have an exercise tolerance test. For further information please visit [email or web address] Please also follow instruction sheet, as given.    Follow-Up: At [facility], you and your health needs are our priority.  As part of our continuing mission to provide you with exceptional heart care, we have created designated Provider Care Teams.  These Care Teams include your primary Cardiologist (physician) and Advanced Practice Providers (APPs -  Physician Assistants and Nurse Practitioners) who all work together to provide you with the care you need, when you need it.  Your next appointment:   6 week(s)  The format for your next appointment:   In Person  Provider:   Kristeen Miss, MD

## 2019-12-16 NOTE — Progress Notes (Signed)
Cardiology Office Note:    Date:  12/16/2019   ID:  [name], DOB 07-Mar-1966, MRN [id number]  PCP:  Ronald Collin, PA-C  Cardiologist:  No primary care provider on file.  Electrophysiologist:  None   Referring MD: Ronald Bay, MD   Chief Complaint  Patient presents with  . Chest Pain    History of Present Illness:    Ronald Chapman is a 54 y.o. male with a hx of hypertension.  Is recently been having some episodes of chest discomfort.  C/o tightness , radiation to left shoulder for past several months  Chest pain has worsened over the past several weeks.  Spontaneous,  Not related to exertion Fairly constant. Was riding indoor bike,  developed a sharp stabbing chest pain on the left side of his chest that lasted for less than a second.  He had 2 of these in a row.  He continued to ride at least 10-15 more minutes before completing his workout.  He did not have any recurrent episodes of chest discomfort.  Took a SL NTG this am and did not notice any difference Has been under lots of stress at home. 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  Sodium is 133.  Potassium is 4.4.  Glucose is 108.  Creatinine 0.9.  Liver enzymes are normal.     Total cholesterol is 195 Triglyceride level is 61 HDL is 77 LDL is 106  TSH is 1.37.    Past Medical History:  Diagnosis Date  . Trigeminal neuralgia     Past Surgical History:  Procedure Laterality Date  . NOSE SURGERY   age 35    Current Medications: Current Meds  Medication Sig  . aspirin EC 81 MG tablet Take 1 tablet (81 mg total) by mouth daily.  . fexofenadine (ALLEGRA) 180 MG tablet Take 180 mg by mouth daily as needed for allergies or rhinitis.  Marland Kitchen gabapentin (NEURONTIN) 100 MG capsule Take 100 mg by mouth as needed.  . metoprolol tartrate (LOPRESSOR) 25 MG tablet One tablet twice daily as needed for palpitations  . nitroGLYCERIN (NITROSTAT) 0.4 MG SL tablet Place  1 tablet (0.4 mg total) under the tongue every 5 (five) minutes as needed for chest pain.  . Oxcarbazepine (TRILEPTAL) 300 MG tablet Take 600 mg by mouth 3 (three) times daily.   Marland Kitchen oxyCODONE-acetaminophen (PERCOCET/ROXICET) 5-325 MG tablet Take 1 tablet by mouth every 4 (four) hours as needed.  . pantoprazole (PROTONIX) 40 MG tablet Take 1 tablet (40 mg total) by mouth daily as needed.     Allergies:   Patient has no known allergies.   Social History   Socioeconomic History  . Marital status: Married    Spouse name: Not on file  . Number of children: Not on file  . Years of education: Not on file  . Highest education level: Not on file  Occupational History  . Occupation: IT trainer  Tobacco Use  . Smoking status: Never Smoker  . Smokeless tobacco: Never Used  Substance and Sexual Activity  . Alcohol use: Yes    Comment: occasional  . Drug use: No  . Sexual activity: Not on file  Other Topics Concern  . Not on file  Social History Narrative   Lives at home with wife.    Social Determinants of Health   Financial Resource Strain:   . Difficulty of Paying Living Expenses: Not on file  Food  Insecurity:   . Worried About Charity fundraiser in the Last Year: Not on file  . Ran Out of Food in the Last Year: Not on file  Transportation Needs:   . Lack of Transportation (Medical): Not on file  . Lack of Transportation (Non-Medical): Not on file  Physical Activity:   . Days of Exercise per Week: Not on file  . Minutes of Exercise per Session: Not on file  Stress:   . Feeling of Stress : Not on file  Social Connections:   . Frequency of Communication with Friends and Family: Not on file  . Frequency of Social Gatherings with Friends and Family: Not on file  . Attends Religious Services: Not on file  . Active Member of Clubs or Organizations: Not on file  . Attends Archivist Meetings: Not on file  . Marital Status: Not on file     Family History: The patient's family  history includes Heart disease (age of onset: 70) in his maternal grandfather.  ROS:   Please see the history of present illness.     All other systems reviewed and are negative.  EKGs/Labs/Other Studies Reviewed:      EKG: December 16, 2019: Normal sinus rhythm at 61.  No ST or T wave changes.  Recent Labs: No results found for requested labs within last 8760 hours.  Recent Lipid Panel No results found for: CHOL, TRIG, HDL, CHOLHDL, VLDL, LDLCALC, LDLDIRECT  Physical Exam:    VS:  BP (!) 152/80   Pulse 61   Ht 5\' 8"  (1.727 m)   Wt 162 lb (73.5 kg)   SpO2 99%   BMI 24.63 kg/m     Wt Readings from Last 3 Encounters:  12/16/19 162 lb (73.5 kg)  11/23/16 155 lb (70.3 kg)  10/08/15 165 lb (74.8 kg)     GEN:  Well nourished, well developed in no acute distress HEENT: Normal NECK: No JVD; No carotid bruits LYMPHATICS: No lymphadenopathy CARDIAC: RRR, no murmurs, rubs, gallops RESPIRATORY:  Clear to auscultation without rales, wheezing or rhonchi  ABDOMEN: Soft, non-tender, non-distended MUSCULOSKELETAL:  No edema; No deformity  SKIN: Warm and dry NEUROLOGIC:  Alert and oriented x 3 PSYCHIATRIC:  Normal affect   ASSESSMENT:    1. Other chest pain   2. Chest pain of uncertain etiology    PLAN:    In order of problems listed above:  1. Chest pain:    Maximilliano presents for further evaluation of chest pain.  He has had chest achiness for the past several months.  The pain is been constant.  It is not related to exercise.  Is been able to work out multiple times with no worsening of the chest pain.  The pain is not pleuritic.  He is tried nitroglycerin with no relief.  His LDL cholesterol is 109.  His HDL is 64.  Triglycerides are 56.  I suspect that this might be GI in origin.  We will have him start omeprazole or similar medication 20 mg a day for the next 2 weeks.  We will bring him back for a treadmill test for further evaluation.  I will see him again in 6 weeks for  follow-up evaluation.  I have instructed him to give [place] a call if he has any worsening pains or problems.   Medication Adjustments/Labs and Tests Ordered: Current medicines are reviewed at length with the patient today.  Concerns regarding medicines are outlined above.  Orders Placed This  Encounter  Procedures  . Exercise Tolerance Test  . EKG 12-Lead   Meds ordered this encounter  Medications  . Omeprazole 20 MG TBDD    Sig: Take 20 mg by mouth every morning. Take 1 pill 20 minutes before breakfast every day for 2 weeks then gradually decrease to take only as needed    Dispense:  30 tablet    Refill:  11      Patient Instructions  Medication Instructions:  Your physician has recommended you make the following change in your medication:  START Omeprazole (Pepcid) 20 mg once every morning 20 minutes before breakfast Take every day for 2 weeks then decrease to every other day or less until you are taking only as needed  *If you need a refill on your cardiac medications before your next appointment, please call your pharmacy*  Lab Work: None Ordered If you have labs (blood work) drawn today and your tests are completely normal, you will receive your results only by: Marland Kitchen MyChart Message (if you have MyChart) OR . A paper copy in the mail If you have any lab test that is abnormal or we need to change your treatment, we will call you to review the results.  Testing/Procedures: Your physician has requested that you have an exercise tolerance test. For further information please visit [email or web address] Please also follow instruction sheet, as given.    Follow-Up: At [facility], you and your health needs are our priority.  As part of our continuing mission to provide you with exceptional heart care, we have created designated Provider Care Teams.  These Care Teams include your primary Cardiologist (physician) and Advanced Practice Providers (APPs -  Physician Assistants and  Nurse Practitioners) who all work together to provide you with the care you need, when you need it.  Your next appointment:   6 week(s)  The format for your next appointment:   In Person  Provider:   Kristeen Miss, MD      Signed, Kristeen Miss, MD  12/16/2019 5:59 PM    [facility]

## 2019-12-17 ENCOUNTER — Other Ambulatory Visit (HOSPITAL_COMMUNITY)
Admission: RE | Admit: 2019-12-17 | Discharge: 2019-12-17 | Disposition: A | Discharge status: Home / Self Care | Payer: 59 | Source: Ambulatory Visit | Attending: Cardiovascular Disease | Admitting: Cardiovascular Disease

## 2019-12-17 DIAGNOSIS — Z01812 Encounter for preprocedural laboratory examination: Secondary | ICD-10-CM | POA: Diagnosis present

## 2019-12-17 DIAGNOSIS — Z20822 Contact with and (suspected) exposure to covid-19: Secondary | ICD-10-CM | POA: Insufficient documentation

## 2019-12-18 LAB — NOVEL CORONAVIRUS, NAA (HOSP ORDER, SEND-OUT TO REF LAB; TAT 18-24 HRS): SARS-CoV-2, NAA: NOT DETECTED

## 2019-12-21 ENCOUNTER — Ambulatory Visit (INDEPENDENT_AMBULATORY_CARE_PROVIDER_SITE_OTHER): Payer: 59

## 2019-12-21 ENCOUNTER — Other Ambulatory Visit: Payer: Self-pay

## 2019-12-21 DIAGNOSIS — R0789 Other chest pain: Secondary | ICD-10-CM

## 2019-12-21 LAB — EXERCISE TOLERANCE TEST
Estimated workload: 13.7 METS
Exercise duration (min): 11 min
Exercise duration (sec): 42 s
MPHR: 168 {beats}/min
Peak HR: 169 {beats}/min
Percent HR: 100 %
RPE: 15
Rest HR: 67 {beats}/min

## 2020-01-30 ENCOUNTER — Encounter: Payer: Self-pay | Admitting: Cardiovascular Disease

## 2020-01-30 NOTE — Progress Notes (Signed)
No show

## 2020-01-31 ENCOUNTER — Ambulatory Visit (INDEPENDENT_AMBULATORY_CARE_PROVIDER_SITE_OTHER): Payer: PRIVATE HEALTH INSURANCE | Admitting: Cardiovascular Disease

## 2020-01-31 DIAGNOSIS — Z5329 Procedure and treatment not carried out because of patient's decision for other reasons: Secondary | ICD-10-CM

## 2020-04-27 DIAGNOSIS — Z91199 Patient's noncompliance with other medical treatment and regimen due to unspecified reason: Secondary | ICD-10-CM | POA: Insufficient documentation

## 2021-10-02 ENCOUNTER — Encounter: Payer: Self-pay | Admitting: *Deleted

## 2021-10-03 ENCOUNTER — Encounter: Payer: Self-pay | Admitting: Neurology

## 2021-10-03 ENCOUNTER — Ambulatory Visit (INDEPENDENT_AMBULATORY_CARE_PROVIDER_SITE_OTHER): Payer: 59 | Admitting: Neurology

## 2021-10-03 VITALS — BP 145/104 | HR 72 | Ht 68.0 in | Wt 157.8 lb

## 2021-10-03 DIAGNOSIS — R202 Paresthesia of skin: Secondary | ICD-10-CM

## 2021-10-03 DIAGNOSIS — Q283 Other malformations of cerebral vessels: Secondary | ICD-10-CM

## 2021-10-03 DIAGNOSIS — G500 Trigeminal neuralgia: Secondary | ICD-10-CM

## 2021-10-03 DIAGNOSIS — G501 Atypical facial pain: Secondary | ICD-10-CM | POA: Diagnosis not present

## 2021-10-03 DIAGNOSIS — R519 Headache, unspecified: Secondary | ICD-10-CM | POA: Diagnosis not present

## 2021-10-03 DIAGNOSIS — H9193 Unspecified hearing loss, bilateral: Secondary | ICD-10-CM

## 2021-10-03 DIAGNOSIS — H93A1 Pulsatile tinnitus, right ear: Secondary | ICD-10-CM

## 2021-10-03 DIAGNOSIS — R200 Anesthesia of skin: Secondary | ICD-10-CM

## 2021-10-03 MED ORDER — OXCARBAZEPINE 300 MG PO TABS: 600.0000 mg | ORAL_TABLET | Freq: Three times a day (TID) | ORAL | 4 refills | Status: DC

## 2021-10-03 NOTE — Addendum Note (Signed)
Addended by: Naomie Dean B on: 10/03/2021 12:15 PM   Modules accepted: Orders

## 2021-10-03 NOTE — Progress Notes (Addendum)
[facility]    Provider:  Dr Lucia Gaskins Requesting Provider: Mattie Marlin, DO Primary Care Provider:  Jamal Collin, PA-C  CC:  TGN  HPI:  Ronald Chapman is a 55 y.o. male here as requested by [name], [name], DO for numbness and pain right side of face/trigeminal neuralgia. PMHx sleep apnea, myoclonus, cervicalgia, trigeminal neuralgia. TGN on the right, started over 10 years ago, went to the dentist many times, prior to the trileptal he was having stabbing/shooting pain, severe, unbearable, mostly in V3 branch, medicine has controlled that mostly, he may still have some stabbing pain that will come through, he has constant numbness in the same area, annoying not painful. Never had any procedures completed on the nerve. He used to clench a lot on the right side, grinding teeth, doesn't do that anymore, wore a mouth guard for a while. Triggers of the trigger pains would be cold temperatures, he has been drinking through a straw, it has been a long time and he can't really recall other triggers. Now much welel controlled on medication with some breakthrough pain sometime and numbness. Stress makes it worse, he is a "high stress" person. He has Gabapentin prn. Been on trileptal for years. He has been having new symptoms more on the right and on both sides. Pulsatile tinnitus, fairly all the time. Can be severe at night worse when laying down, can be very loud.   Reviewed notes, labs and imaging from outside physicians, which showed:   08/07/2021: CMP with Na 132 otherwise unremarkable  I reviewed Dr. Kennieth Francois notes: Patient has a history of trigeminal neuralgia, atypical facial pain when the patient sees neurology and had a nonspecific MRI of the brain diagnosed with trigeminal neuralgia and currently controlled on Trileptal, neurologist has retired and he would like to get a second opinion, bilateral tinnitus was due for MRI, he also has a fracture at C7 and the left lamina  and the articular pillar after skiing accident in 2011 treated with a neck brace for 3 months without any issues, has mild sleep apnea, tried CPAP machine but could not tolerate it.  I was able to locate neurology notes he was last seen in November 2021 by Dr. Hyacinth Meeker at [facility] neurolog[facility] and sleep, intermittent jerks of the extremities, 20-30 times a day, he is on Trileptal 150 mg to 3 times daily, occasionally dizziness, pressure back of head lasting an hour not overwhelming, some neck pain, Trileptal has been associated with hyponatremia he was advised to increase his salt intake, lots of stress, in the setting of separation of his wife and illness and his pet.  It appears he had been being seen multiple times, he had an episode of passing out and could not talk felt fine after 30 minutes and given sugar, the twitching is slightly myoclonic-like movement more in the left, all appearing to be stress related.  He has been seen in the past with intermittent right facial pain that has been static for several years, the stabbing pain in the face is less responsive to the Trileptal as of late, MRI of the head was last done February 2006 and was normal (by report), lots of stress, impatience, intolerance, inability to focus.  He also fell while skiing and he fractured his C7 seen by neurosurgery in [place] this was in 2011 February, numbing pain in the right face to the infraorbital and right area, near the eye, rates it 1-2 out of 10 in pain, no other health  issues.  He has reported stabbing shooting pains in the area of the trigeminal nerve in the past with numbness, he also grinds his teeth.  I reviewed notes going back to 2016.  Neurologic exam was normal.   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 Prominent  vascular loop at the root entry zone of the right fifth nerve. There  could be vascular etiology of the right trigeminal neuralgia. MR  angiography suggested for more complete evaluation. (Dr. Karin Golden)   Review of Systems: Patient complains of symptoms per HPI as well as the following symptoms tinnitus. Pertinent negatives and positives per HPI. All others negative.   Social History   Socioeconomic History   Marital status: Married    Spouse name: Not on file   Number of children: Not on file   Years of education: Not on file   Highest education level: Not on file  Occupational History   Occupation: IT trainer  Tobacco Use   Smoking status: Never   Smokeless tobacco: Never  Substance and Sexual Activity   Alcohol use: Yes    Alcohol/week: 10.0 standard drinks    Types: 10 Glasses of wine per week    Comment: occasional   Drug use: No   Sexual activity: Not on file  Other Topics Concern   Not on file  Social History Narrative   Lives at home with wife.    Social Determinants of Health   Financial Resource Strain: Not on file  Food Insecurity: Not on file  Transportation Needs: Not on file  Physical Activity: Not on file  Stress: Not on file  Social Connections: Not on file  Intimate Partner Violence: Not on file    Family History  Problem Relation Age of Onset   Heart disease Maternal Grandfather 72   Headache Neg Hx     Past Medical History:  Diagnosis Date   Cervicalgia    GERD (gastroesophageal reflux disease)    Myoclonus    Sleep apnea    Trigeminal neuralgia     Patient Active Problem List   Diagnosis Date Noted   No-show for appointment 04/27/2020   Chest pain of uncertain etiology 12/16/2019   Pain in the chest    Trigeminal neuralgia    Elevated blood sugar    Hypertensive emergency 07/16/2015    Past Surgical History:  Procedure Laterality Date    appendectomy     COLONOSCOPY  2020   HERNIA REPAIR     nose sugery     NOSE SURGERY   age 70    Current Outpatient Medications  Medication Sig Dispense Refill   gabapentin (NEURONTIN) 100 MG capsule Take 100 mg by mouth as needed.     Oxcarbazepine (TRILEPTAL) 300 MG tablet Take 2 tablets (600 mg total) by mouth 3 (three) times daily. 540 tablet 4   No current facility-administered medications for this visit.    Allergies as of 10/03/2021   (No Known Allergies)    Vitals: BP (!) 145/104   Pulse 72   Ht 5\' 8"  (1.727 m)   Wt 157 lb 12.8 oz (71.6 kg)   BMI 23.99 kg/m  Last Weight:  Wt Readings from Last 1 Encounters:  10/03/21 157 lb 12.8 oz (71.6 kg)   Last Height:  Ht Readings from Last 1 Encounters:  10/03/21 5\' 8"  (1.727 m)     Physical exam: Exam: Gen: NAD, conversant, well nourised, well groomed                     CV: RRR, no MRG. No Carotid Bruits. No peripheral edema, warm, nontender Eyes: Conjunctivae clear without exudates or hemorrhage  Neuro: Detailed Neurologic Exam  Speech:    Speech is normal; fluent and spontaneous with normal comprehension.  Cognition:    The patient is oriented to person, place, and time;     recent and remote memory intact;     language fluent;     normal attention, concentration,     fund of knowledge Cranial Nerves:    The pupils are equal, round, and reactive to light. Pupils too small to visualize fundi. Visual fields are full to finger confrontation. Extraocular movements are intact. Trigeminal sensation is intact and the muscles of mastication are normal. The face is symmetric. The palate elevates in the midline. Hearing intact. Voice is normal. Shoulder shrug is normal. The tongue has normal motion without fasciculations.   Coordination:    Normal finger to nose   Gait:    normal.   Motor Observation:    No asymmetry, no atrophy, and no involuntary movements noted. Tone:    Normal muscle tone.    Posture:     Posture is normal. normal erect    Strength:    Strength is V/V in the upper and lower limbs.      Sensation: intact to LT     Reflex Exam:  DTR's:    Deep tendon reflexes in the upper and lower extremities are normal bilaterally.   Toes:    The toes are downgoing bilaterally.   Clonus:    Clonus is absent.    Assessment/Plan: Patient with trigeminal neuralgia, MRI in 2016 showed vascular loop compressing the trigeminal nerve, at this point he is considering decompression surgery so we will repeat MRI of the head and face with trigeminal protocol.  He is also having a new symptom pulsatile tinnitus more on the right than left we will order an MRI of the brain and an MRI of the head to evaluate for any lesions, space-occupying masses, aneurysms or bleeding, vascular malformations that can be causes of pulsatile tinnitus.  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 Prominent vascular loop at the root entry zone of the right fifth nerve. There could be vascular etiology of the right trigeminal neuralgia. MR angiography suggested for more complete evaluation" Electronically Signed By: 08/17/2015 M.D. - Continue Trileptal for TGN. Send to Dr. Paulina Fusi he would like to consider vascular decompression.   Orders Placed This Encounter  Procedures   MR BRAIN W WO CONTRAST   MR ANGIO HEAD WO CONTRAST   MR FACE/TRIGEMINAL WO/W CM   Ambulatory referral to Neurosurgery    Meds ordered this encounter  Medications   Oxcarbazepine (TRILEPTAL) 300 MG tablet    Sig: Take 2 tablets (600 mg total) by mouth 3 (three) times daily.    Dispense:  540 tablet    Refill:  4    Cc: [name], [name], DO,  Hedgecock, [place], PA-C  [place], MD  [facility] [address]  [place], [place] [address]  Phone [phone] Fax [phone]

## 2021-10-03 NOTE — Patient Instructions (Signed)
MRI brain and MRA head MRI Head/Face Trigeminal Protocol  Trigeminal Neuralgia Trigeminal neuralgia is a nerve disorder that causes severe pain on one side of the face. The pain may last from a few seconds to several minutes. The pain is usually only on one side of the face. Symptoms may occur for days, weeks, or months and then go away for months or years. The pain may return and be worse than before. What are the causes? This condition is caused by damage or pressure to a nerve in the head that is called the trigeminal nerve. An attack can be triggered by: Talking. Chewing. Putting on makeup. Washing your face. Shaving your face. Brushing your teeth. Touching your face. What increases the risk? You are more likely to develop this condition if you: Are 25 years of age or older. Are male. What are the signs or symptoms? The main symptom of this condition is severe pain in the: Jaw. Lips. Eyes. Nose. Scalp. Forehead. Face. The pain may be: Intense. Stabbing. Electric. Shock-like. How is this diagnosed? This condition is diagnosed with a physical exam. A CT scan or an MRI may be done to rule out other conditions that can cause facial pain. How is this treated? This condition may be treated with: Avoiding the things that trigger your symptoms. Taking prescription medicines (anticonvulsants). Having surgery. This may be done in severe cases if other medical treatment does not provide relief. Having procedures such as ablation, thermal, or radiation therapy. It may take up to one month for treatment to start relieving the pain. Follow these instructions at home: Managing pain Learn as much as you can about how to manage your pain. Ask your health care provider if a pain specialist would be helpful. Consider talking with a mental health care provider (psychologist) about how to cope with the pain. Consider joining a pain support group. General instructions Take  over-the-counter and prescription medicines only as told by your health care provider. Avoid the things that trigger your symptoms. It may help to: Chew on the unaffected side of your mouth. Avoid touching your face. Avoid blasts of hot or cold air. Follow your treatment plan as told by your health care provider. This may include: Cognitive or behavioral therapy. Gentle, regular exercise. Meditation or yoga. Aromatherapy. Keep all follow-up visits as told by your health care provider. You may need to be monitored closely to make sure treatment is working well for you. Where to find more information Facial Pain Association: fpa-support.org Contact a health care provider if: Your medicine is not helping your symptoms. You have side effects from the medicine used for treatment. You develop new, unexplained symptoms, such as: Double vision. Facial weakness. Facial numbness. Changes in hearing or balance. You feel depressed. Get help right away if: Your pain is severe and is not getting better. You develop suicidal thoughts. If you ever feel like you may hurt yourself or others, or have thoughts about taking your own life, get help right away. You can go to your nearest emergency department or call: Your local emergency services (911 in the [place] A suicide crisis helpline, such as the National Suicide Prevention Lifeline at (872) 197-5887. This is open 24 hours a day. Summary Trigeminal neuralgia is a nerve disorder that causes severe pain on one side of the face. The pain may last from a few seconds to several minutes. This condition is caused by damage or pressure to a nerve in the head that is called the trigeminal nerve.  Treatment may include avoiding the things that trigger your symptoms, taking medicines, or having surgery or procedures. It may take up to one month for treatment to start relieving the pain. Avoid the things that trigger your symptoms. Keep all follow-up visits as  told by your health care provider. You may need to be monitored closely to make sure treatment is working well for you. This information is not intended to replace advice given to you by your health care provider. Make sure you discuss any questions you have with your health care provider. Document Revised: 10/12/2018 Document Reviewed: 10/12/2018 Elsevier Patient Education  2022 Elsevier Inc.  Tinnitus Tinnitus refers to hearing a sound when there is no actual source for that sound. This is often described as ringing in the ears. However, people with this condition may hear a variety of noises, in one ear or in both ears. The sounds of tinnitus can be soft, loud, or somewhere in between. Tinnitus can last for a few seconds or can be constant for days. It may go away without treatment and come back at various times. When tinnitus is constant or happens often, it can lead to other problems, such as trouble sleeping and trouble concentrating. Almost everyone experiences tinnitus at some point. Tinnitus is not the same as hearing loss. Tinnitus that is long-lasting (chronic) or comes back often (recurs) may require medical attention. What are the causes? The cause of tinnitus is often not known. In some cases, it can result from: Exposure to loud noises from machinery, music, or other sources. An object (foreign body) stuck in the ear. Earwax buildup. Drinking alcohol or caffeine. Taking certain medicines. Age-related hearing loss. It may also be caused by medical conditions such as: Ear or sinus infections. Heart diseases or high blood pressure. Allergies. Mnire's disease. Thyroid problems. Tumors. A weak, bulging blood vessel (aneurysm) near the ear. What increases the risk? The following factors may make you more likely to develop this condition: Exposure to loud noises. Age. Tinnitus is more likely in older individuals. Using alcohol or tobacco. What are the signs or symptoms? The  main symptom of tinnitus is hearing a sound when there is no source for that sound. It may sound like: Buzzing. Sizzling. Ringing. Blowing air. Hissing. Whistling. Other sounds may include: Roaring. Running water. A musical note. Tapping. Humming. Symptoms may affect only one ear (unilateral) or both ears (bilateral). How is this diagnosed? Tinnitus is diagnosed based on your symptoms, your medical history, and a physical exam. Your health care provider may do a thorough hearing test (audiologic exam) if your tinnitus: Is unilateral. Causes hearing difficulties. Lasts 6 months or longer. You may work with a health care provider who specializes in hearing disorders (audiologist). You may be asked questions about your symptoms and how they affect your daily life. You may have other tests done, such as: CT scan. MRI. An imaging test of how blood flows through your blood vessels (angiogram). How is this treated? Treating an underlying medical condition can sometimes make tinnitus go away. If your tinnitus continues, other treatments may include: Therapy and counseling to help you manage the stress of living with tinnitus. Sound generators to mask the tinnitus. These include: Tabletop sound machines that play relaxing sounds to help you fall asleep. Wearable devices that fit in your ear and play sounds or music. Acoustic neural stimulation. This involves using headphones to listen to music that contains an auditory signal. Over time, listening to this signal may change some  pathways in your brain and make you less sensitive to tinnitus. This treatment is used for very severe cases when no other treatment is working. Using hearing aids or cochlear implants if your tinnitus is related to hearing loss. Hearing aids are worn in the outer ear. Cochlear implants are surgically placed in the inner ear. Follow these instructions at home: Managing symptoms   When possible, avoid being in loud  places and being exposed to loud sounds. Wear hearing protection, such as earplugs, when you are exposed to loud noises. Use a white noise machine, a humidifier, or other devices to mask the sound of tinnitus. Practice techniques for reducing stress, such as meditation, yoga, or deep breathing. Work with your health care provider if you need help with managing stress. Sleep with your head slightly raised. This may reduce the impact of tinnitus. General instructions Do not use stimulants, such as nicotine, alcohol, or caffeine. Talk with your health care provider about other stimulants to avoid. Stimulants are substances that can make you feel alert and attentive by increasing certain activities in the body (such as heart rate and blood pressure). These substances may make tinnitus worse. Take over-the-counter and prescription medicines only as told by your health care provider. Try to get plenty of sleep each night. Keep all follow-up visits. This is important. Contact a health care provider if: Your tinnitus continues for 3 weeks or longer without stopping. You develop sudden hearing loss. Your symptoms get worse or do not get better with home care. You feel you are not able to manage the stress of living with tinnitus. Get help right away if: You develop tinnitus after a head injury. You have tinnitus along with any of the following: Dizziness. Nausea and vomiting. Loss of balance. Sudden, severe headache. Vision changes. Facial weakness or weakness of arms or legs. These symptoms may represent a serious problem that is an emergency. Do not wait to see if the symptoms will go away. Get medical help right away. Call your local emergency services (911 in the [place] Do not drive yourself to the hospital. Summary Tinnitus refers to hearing a sound when there is no actual source for that sound. This is often described as ringing in the ears. Symptoms may affect only one ear (unilateral) or  both ears (bilateral). Use a white noise machine, a humidifier, or other devices to mask the sound of tinnitus. Do not use stimulants, such as nicotine, alcohol, or caffeine. These substances may make tinnitus worse. This information is not intended to replace advice given to you by your health care provider. Make sure you discuss any questions you have with your health care provider. Document Revised: 10/30/2020 Document Reviewed: 10/30/2020 Elsevier Patient Education  2022 [employer].

## 2021-10-29 ENCOUNTER — Ambulatory Visit
Admission: RE | Admit: 2021-10-29 | Discharge: 2021-10-29 | Disposition: A | Discharge status: Home / Self Care | Payer: 59 | Source: Ambulatory Visit | Attending: Neurology | Admitting: Neurology

## 2021-10-29 ENCOUNTER — Other Ambulatory Visit: Payer: Self-pay

## 2021-10-29 DIAGNOSIS — R200 Anesthesia of skin: Secondary | ICD-10-CM

## 2021-10-29 DIAGNOSIS — H9193 Unspecified hearing loss, bilateral: Secondary | ICD-10-CM

## 2021-10-29 DIAGNOSIS — G500 Trigeminal neuralgia: Secondary | ICD-10-CM | POA: Diagnosis not present

## 2021-10-29 DIAGNOSIS — G501 Atypical facial pain: Secondary | ICD-10-CM

## 2021-10-29 DIAGNOSIS — Q283 Other malformations of cerebral vessels: Secondary | ICD-10-CM

## 2021-10-29 DIAGNOSIS — R202 Paresthesia of skin: Secondary | ICD-10-CM

## 2021-10-29 DIAGNOSIS — H93A1 Pulsatile tinnitus, right ear: Secondary | ICD-10-CM

## 2021-10-29 DIAGNOSIS — R519 Headache, unspecified: Secondary | ICD-10-CM

## 2021-10-29 MED ORDER — GADOBENATE DIMEGLUMINE 529 MG/ML IV SOLN
14.0000 mL | Freq: Once | INTRAVENOUS | Status: AC | PRN
  Administered 2021-10-29: 14 mL via INTRAVENOUS

## 2021-10-31 ENCOUNTER — Telehealth: Payer: Self-pay | Admitting: *Deleted

## 2021-10-31 NOTE — Telephone Encounter (Signed)
-----   Message from Anson Fret, MD sent at 10/30/2021  1:03 PM EST ----- Patient has a vascular loop compressing the trigeminal nerve. I recommend decompressive surgery. We can send him to Dr. Tempie Donning at [facility] (TGN decompression is his specialty) but Dr. Maurice Small also performs these surgeries and is excellent and he is here [place]. Patient's choice, please discuss with him and let me know thanks. If he goes to [facility] he will have to bring a disk with him with his MRIs thanks

## 2021-10-31 NOTE — Telephone Encounter (Signed)
Called pt & LVM (ok per DPR) advising him of his MRI face/trigeminal results which indicates he has a vascular loop compressing the trigeminal nerve. I advised pt of Dr Trevor Mace recommendation to have a decompression surgery and left the details on the message of his options. Left office number in message asking for call back asap to let us know whom he would like to see and we will send the referral.

## 2021-11-06 NOTE — Telephone Encounter (Signed)
Called pt again and LVM (ok per DPR) advising pt of his MRI results which show a vascular loop pressing on trigeminal nerve. Dr Lucia Gaskins recommends decompression surgery, options include staying here in [place] and seeing Dr Maurice Small or referral to Dr Tempie Donning at [facility]. Asked for call back to discuss. Left office number in message.

## 2021-11-08 ENCOUNTER — Encounter: Payer: Self-pay | Admitting: *Deleted

## 2021-11-08 NOTE — Telephone Encounter (Signed)
Pt called.  Gave him results of MRI per Dr. Lucia Gaskins, he stated he has seen Dr. Margarite Gouge went over results.  He is thinking this over.  Appreciated call back.  I relayed letter will be coming to him, Product/process development scientist letter was done, did not mail postal letter).

## 2021-11-08 NOTE — Telephone Encounter (Signed)
I called the patient and left him another message asking for call back to discuss his MRI results.  I also called the wife and let her know that we have been trying to reach the patient and asked for either her or the patient to give [place] a call back.  Left office number and hours in message.  Letter mailed to pt as well.

## 2021-11-09 ENCOUNTER — Telehealth: Payer: Self-pay | Admitting: *Deleted

## 2021-11-09 NOTE — Telephone Encounter (Signed)
-----   Message from Vesta Mixer, MD sent at 11/09/2021 12:46 PM EST ----- Regarding: return patient eval Ronald Chapman was seen several years ago for cp He recently has had some episodes of syncope / presyncope His wife requested a follow up appt. I have recommended that he hydrate better with Liquid IV and see if that helps.   He can be worked into an available slot in the next month or so   Thanks  PN

## 2021-11-09 NOTE — Telephone Encounter (Signed)
Left message to call back on patients phone.  Called wife (DRP) and was able to reach her. Went over recommendation and set up appointment. Verbalized agreement with appointment read back.

## 2021-11-12 ENCOUNTER — Ambulatory Visit: Payer: 59 | Admitting: Cardiovascular Disease

## 2021-11-12 ENCOUNTER — Encounter: Payer: Self-pay | Admitting: Cardiovascular Disease

## 2021-11-12 NOTE — Progress Notes (Signed)
Cardiology Office Note:    Date:  11/13/2021   ID:  Ronald Chapman, DOB 1965/12/23, MRN [id number]  PCP:  Mattie Marlin, DO  Cardiologist:  Kristeen Miss, MD  Electrophysiologist:  None   Referring MD: Mattie Marlin, DO   Chief Complaint  Patient presents with   Loss of Consciousness    History of Present Illness:    Ronald Chapman is a 55 y.o. male with a hx of hypertension.  Is recently been having some episodes of chest discomfort.  C/o tightness , radiation to left shoulder for past several months  Chest pain has worsened over the past several weeks.  Spontaneous,  Not related to exertion Fairly constant. Was riding indoor bike,  developed a sharp stabbing chest pain on the left side of his chest that lasted for less than a second.  He had 2 of these in a row.  He continued to ride at least 10-15 more minutes before completing his workout.  He did not have any recurrent episodes of chest discomfort.  Took a SL NTG this am and did not notice any difference Has been under lots of stress at home. 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  Sodium is 133.  Potassium is 4.4.  Glucose is 108.  Creatinine 0.9.  Liver enzymes are normal.     Total cholesterol is 195 Triglyceride level is 61 HDL is 77 LDL is 106  TSH is 1.37.  Dec. 6, 2022: Ronald Chapman is seen today for some recent syncope and presyncope Has had some lightheadedness after he works out very intensly.  We had him add electrolytes to his water.   Has orthostasis when he leans over to pick up something  Trileptal has dizziness listed as its first side effect.     Past Medical History:  Diagnosis Date   Cervicalgia    GERD (gastroesophageal reflux disease)    Myoclonus    Sleep apnea    Trigeminal neuralgia     Past Surgical History:  Procedure Laterality Date   appendectomy     COLONOSCOPY  2020   HERNIA REPAIR     nose sugery     NOSE SURGERY   age 6     Current Medications: Current Meds  Medication Sig   gabapentin (NEURONTIN) 100 MG capsule Take 100 mg by mouth as needed.   Oxcarbazepine (TRILEPTAL) 300 MG tablet Take 2 tablets (600 mg total) by mouth 3 (three) times daily.     Allergies:   Patient has no known allergies.   Social History   Socioeconomic History   Marital status: Married    Spouse name: Not on file   Number of children: Not on file   Years of education: Not on file   Highest education level: Not on file  Occupational History   Occupation: IT trainer  Tobacco Use   Smoking status: Never   Smokeless tobacco: Never  Substance and Sexual Activity   Alcohol use: Yes    Alcohol/week: 10.0 standard drinks    Types: 10 Glasses of wine per week    Comment: occasional   Drug use: No   Sexual activity: Not on file  Other Topics Concern   Not on file  Social History Narrative   Lives at home with wife.    Social Determinants of Health   Financial Resource Strain: Not on file  Food Insecurity: Not on file  Transportation  Needs: Not on file  Physical Activity: Not on file  Stress: Not on file  Social Connections: Not on file     Family History: The patient's family history includes Heart disease (age of onset: 23) in his maternal grandfather. There is no history of Headache.  ROS:   Please see the history of present illness.     All other systems reviewed and are negative.  EKGs/Labs/Other Studies Reviewed:        Recent Labs: No results found for requested labs within last 8760 hours.  Recent Lipid Panel No results found for: CHOL, TRIG, HDL, CHOLHDL, VLDL, LDLCALC, LDLDIRECT  Physical Exam:    Physical Exam: Blood pressure (!) 130/98, pulse (!) 59, height 5\' 8"  (1.727 m), weight 154 lb 9.6 oz (70.1 kg), SpO2 99 %.  GEN:  Well nourished, well developed in no acute distress HEENT: Normal NECK: No JVD; No carotid bruits LYMPHATICS: No lymphadenopathy CARDIAC: RRR , no murmurs, rubs,  gallops RESPIRATORY:  Clear to auscultation without rales, wheezing or rhonchi  ABDOMEN: Soft, non-tender, non-distended MUSCULOSKELETAL:  No edema; No deformity  SKIN: Warm and dry NEUROLOGIC:  Alert and oriented x 3  EKG:   Dec. 6, 2022:  sinus brady at 59.  No ST or T wave changes.     ASSESSMENT:    1. Orthostatic hypotension     PLAN:      Orthostatic hypotension: Ronald Chapman is having some issues with orthostatic hypotension.  Most of his episodes sound like normal physiology.  He has episodes of syncope/presyncope after he is had a very intense workout.  He also has episodes of orthostasis when he bends over and then stands up quickly.  I have reminded him to stay well-hydrated.  He is on Trileptal for trigeminal neuralgia.  It lists dizziness as 1 of its primary side effects.  I have asked him to consult with his medical doctor to see if this might be causing some of his issues.  His blood pressures were little bit elevated today but I hesitate to start him on anything at this point.  He will keep a blood pressure log and bring it when he comes to see me again in 3 months.   I have instructed him to give Ronald Chapman a call if he has any worsening pains or problems.   Medication Adjustments/Labs and Tests Ordered: Current medicines are reviewed at length with the patient today.  Concerns regarding medicines are outlined above.  Orders Placed This Encounter  Procedures   EKG 12-Lead    No orders of the defined types were placed in this encounter.     Patient Instructions  Medication Instructions:  Your physician recommends that you continue on your current medications as directed. Please refer to the Current Medication list given to you today.  *If you need a refill on your cardiac medications before your next appointment, please call your pharmacy*   Lab Work: None If you have labs (blood work) drawn today and your tests are completely normal, you will receive your  results only by: MyChart Message (if you have MyChart) OR A paper copy in the mail If you have any lab test that is abnormal or we need to change your treatment, we will call you to review the results.   Testing/Procedures: None   Follow-Up: At [facility], you and your health needs are our priority.  As part of our continuing mission to provide you with exceptional heart care, we have created designated Provider  Care Teams.  These Care Teams include your primary Cardiologist (physician) and Advanced Practice Providers (APPs -  Physician Assistants and Nurse Practitioners) who all work together to provide you with the care you need, when you need it.  We recommend signing up for the patient portal called "MyChart".  Sign up information is provided on this After Visit Summary.  MyChart is used to connect with patients for Virtual Visits (Telemedicine).  Patients are able to view lab/test results, encounter notes, upcoming appointments, etc.  Non-urgent messages can be sent to your provider as well.   To learn more about what you can do with MyChart, go to [email or web address].    Your next appointment:   3 month(s)  The format for your next appointment:   In Person  Provider:   Kristeen Miss, MD     Other Instructions     Signed, Kristeen Miss, MD  11/13/2021 2:09 PM    [facility]

## 2021-11-13 ENCOUNTER — Ambulatory Visit (INDEPENDENT_AMBULATORY_CARE_PROVIDER_SITE_OTHER): Payer: 59 | Admitting: Cardiovascular Disease

## 2021-11-13 ENCOUNTER — Other Ambulatory Visit: Payer: Self-pay

## 2021-11-13 ENCOUNTER — Encounter: Payer: Self-pay | Admitting: Cardiovascular Disease

## 2021-11-13 VITALS — BP 130/98 | HR 59 | Ht 68.0 in | Wt 154.6 lb

## 2021-11-13 DIAGNOSIS — I951 Orthostatic hypotension: Secondary | ICD-10-CM

## 2021-11-13 NOTE — Patient Instructions (Signed)
Medication Instructions:  Your physician recommends that you continue on your current medications as directed. Please refer to the Current Medication list given to you today.  *If you need a refill on your cardiac medications before your next appointment, please call your pharmacy*   Lab Work: None If you have labs (blood work) drawn today and your tests are completely normal, you will receive your results only by: MyChart Message (if you have MyChart) OR A paper copy in the mail If you have any lab test that is abnormal or we need to change your treatment, we will call you to review the results.   Testing/Procedures: None   Follow-Up: At [facility], you and your health needs are our priority.  As part of our continuing mission to provide you with exceptional heart care, we have created designated Provider Care Teams.  These Care Teams include your primary Cardiologist (physician) and Advanced Practice Providers (APPs -  Physician Assistants and Nurse Practitioners) who all work together to provide you with the care you need, when you need it.  We recommend signing up for the patient portal called "MyChart".  Sign up information is provided on this After Visit Summary.  MyChart is used to connect with patients for Virtual Visits (Telemedicine).  Patients are able to view lab/test results, encounter notes, upcoming appointments, etc.  Non-urgent messages can be sent to your provider as well.   To learn more about what you can do with MyChart, go to [email or web address].    Your next appointment:   3 month(s)  The format for your next appointment:   In Person  Provider:   Kristeen Miss, MD     Other Instructions

## 2022-02-06 ENCOUNTER — Encounter: Payer: Self-pay | Admitting: Cardiovascular Disease

## 2022-02-06 ENCOUNTER — Other Ambulatory Visit: Payer: Self-pay

## 2022-02-06 ENCOUNTER — Ambulatory Visit (INDEPENDENT_AMBULATORY_CARE_PROVIDER_SITE_OTHER): Payer: 59 | Admitting: Cardiovascular Disease

## 2022-02-06 VITALS — BP 132/74 | HR 78 | Ht 68.0 in | Wt 141.6 lb

## 2022-02-06 DIAGNOSIS — I951 Orthostatic hypotension: Secondary | ICD-10-CM | POA: Diagnosis not present

## 2022-02-06 DIAGNOSIS — G500 Trigeminal neuralgia: Secondary | ICD-10-CM

## 2022-02-06 NOTE — Progress Notes (Signed)
? ?Cardiology Office Note:   ? ?Date:  02/06/2022  ? ?ID:  Ronald Chapman, DOB 1966-05-14, MRN [id number] ? ?PCP:  [name], [name], DO  ?Cardiologist:  Mertie Moores, MD  ?Electrophysiologist:  None  ? ?Referring MD: Percell Belt, DO  ? ?Chief Complaint  ?Patient presents with  ? Dizziness  ? ? ?History of Present Illness:   ? ?Ronald Chapman is a 56 y.o. male with a hx of hypertension.  Is recently been having some episodes of chest discomfort. ? ?C/o tightness , radiation to left shoulder for past several months  ?Chest pain has worsened over the past several weeks.  ?Spontaneous,  Not related to exertion ?Fairly constant. ?Was riding indoor bike,  developed a sharp stabbing chest pain on the left side of his chest that lasted for less than a second.  He had 2 of these in a row.  He continued to ride at least 10-15 more minutes before completing his workout.  He did not have any recurrent episodes of chest discomfort. ? ?Took a SL NTG this am and did not notice any difference ?Has been under lots of stress at home. ?Is a Engineer, maintenance (IT)  ?Primary is a [employer] doctor  ? ?Labs from care everywhere are available.  Sodium is 133.  Potassium is 4.4.  Glucose is 108.  Creatinine 0.9.  Liver enzymes are normal.  ? ? ? Total cholesterol is 195 ?Triglyceride level is 61 ?HDL is 77 ?LDL is 106 ? ?TSH is 1.37. ? ?Dec. 6, 2022: ?Ronald Chapman is seen today for some recent syncope and presyncope ?Has had some lightheadedness after he works out very [employer].  ?We had him add electrolytes to his water.   ?Has orthostasis when he leans over to pick up something  ?Trileptal has dizziness listed as its first side effect.  ? ?February 06, 2022: ?Ronald Chapman is seen for follow up of his syncope and orthostatic hypotension  ? Started working out more again  - is working out in the am now  ?Still taking trileptal  for trigeminal neuralgia  ? ?Has lost 15 lbs,  has stopped alcohol.  Works out every day  ?Feeling great  ? ? ? ?Past Medical History:  ?Diagnosis  Date  ? Cervicalgia   ? GERD (gastroesophageal reflux disease)   ? Myoclonus   ? Sleep apnea   ? Trigeminal neuralgia   ? ? ?Past Surgical History:  ?Procedure Laterality Date  ? appendectomy    ? COLONOSCOPY  2020  ? HERNIA REPAIR    ? nose sugery    ? NOSE SURGERY   age 53  ? ? ?Current Medications: ?Current Meds  ?Medication Sig  ? gabapentin (NEURONTIN) 100 MG capsule Take 100 mg by mouth as needed.  ? Oxcarbazepine (TRILEPTAL) 300 MG tablet Take 2 tablets (600 mg total) by mouth 3 (three) times daily.  ? triamcinolone cream (KENALOG) 0.1 % Apply 1 application topically 2 (two) times daily as needed.  ?  ? ?Allergies:   Patient has no known allergies.  ? ?Social History  ? ?Socioeconomic History  ? Marital status: Married  ?  Spouse name: Not on file  ? Number of children: Not on file  ? Years of education: Not on file  ? Highest education level: Not on file  ?Occupational History  ? Occupation: Engineer, maintenance (IT)  ?Tobacco Use  ? Smoking status: Never  ? Smokeless tobacco: Never  ?Substance and Sexual Activity  ? Alcohol use: Yes  ?  Alcohol/week: 10.0 standard drinks  ?  Types: 10 Glasses of wine per week  ?  Comment: occasional  ? Drug use: No  ? Sexual activity: Not on file  ?Other Topics Concern  ? Not on file  ?Social History Narrative  ? Lives at home with wife.   ? ?Social Determinants of Health  ? ?Financial Resource Strain: Not on file  ?Food Insecurity: Not on file  ?Transportation Needs: Not on file  ?Physical Activity: Not on file  ?Stress: Not on file  ?Social Connections: Not on file  ?  ? ?Family History: ?The patient's family history includes Heart disease (age of onset: 52) in his maternal grandfather. There is no history of Headache. ? ?ROS:   ?Please see the history of present illness.    ? All other systems reviewed and are negative. ? ?EKGs/Labs/Other Studies Reviewed:   ? ? ?Recent Labs: ?No results found for requested labs within last 8760 hours.  ?Recent Lipid Panel ?No results found for: CHOL, TRIG,  HDL, CHOLHDL, VLDL, LDLCALC, LDLDIRECT ? ?Physical Exam:   ? ?Physical Exam: ?Blood pressure 132/74, pulse 78, height 5\' 8"  (1.727 m), weight 141 lb 9.6 oz (64.2 kg), SpO2 98 %. ? ?GEN:  Well nourished, well developed in no acute distress ?HEENT: Normal ?NECK: No JVD; No carotid bruits ?LYMPHATICS: No lymphadenopathy ?CARDIAC: RRR   ?RESPIRATORY:  Clear to auscultation without rales, wheezing or rhonchi  ?ABDOMEN: Soft, non-tender, non-distended ?MUSCULOSKELETAL:  No edema; No deformity  ?SKIN: Warm and dry ?NEUROLOGIC:  Alert and oriented x 3 ? ? ?EKG:    ? ? ? ?ASSESSMENT:   ? ?No diagnosis found. ? ? ?PLAN:   ? ? ? ?Orthostatic hypotension:  ?His symptoms of orthostasis have resolved.   Works out daily  ?Is tolerating the Trileptal  ? ?We will see him on as needed basis  ? ? ? ?Medication Adjustments/Labs and Tests Ordered: ?Current medicines are reviewed at length with the patient today.  Concerns regarding medicines are outlined above.  ?No orders of the defined types were placed in this encounter. ? ? ?No orders of the defined types were placed in this encounter. ? ? ?Patient Instructions  ?Medication Instructions:  ?Your physician recommends that you continue on your current medications as directed. Please refer to the Current Medication list given to you today. ? ?*If you need a refill on your cardiac medications before your next appointment, please call your pharmacy* ? ? ?Lab Work: ?NONE ?If you have labs (blood work) drawn today and your tests are completely normal, you will receive your results only by: ?MyChart Message (if you have MyChart) OR ?A paper copy in the mail ?If you have any lab test that is abnormal or we need to change your treatment, we will call you to review the results. ? ? ?Testing/Procedures: ?NONE ? ? ?Follow-Up: As needed ?At [facility], you and your health needs are our priority.  As part of our continuing mission to provide you with exceptional heart care, we have created  designated Provider Care Teams.  These Care Teams include your primary Cardiologist (physician) and Advanced Practice Providers (APPs -  Physician Assistants and Nurse Practitioners) who all work together to provide you with the care you need, when you need it. ? ?We recommend signing up for the patient portal called "MyChart".  Sign up information is provided on this After Visit Summary.  MyChart is used to connect with patients for Virtual Visits (Telemedicine).  Patients are able  to view lab/test results, encounter notes, upcoming appointments, etc.  Non-urgent messages can be sent to your provider as well.   ?To learn more about what you can do with MyChart, go to [email or web address].   ? ? ? ?Provider:   ?Mertie Moores, MD   ? ?  ? ?Signed, ?Mertie Moores, MD  ?02/06/2022 8:45 AM    ?[place] ? ? ?

## 2022-02-06 NOTE — Patient Instructions (Signed)
Medication Instructions:  ?Your physician recommends that you continue on your current medications as directed. Please refer to the Current Medication list given to you today. ? ?*If you need a refill on your cardiac medications before your next appointment, please call your pharmacy* ? ? ?Lab Work: ?NONE ?If you have labs (blood work) drawn today and your tests are completely normal, you will receive your results only by: ?MyChart Message (if you have MyChart) OR ?A paper copy in the mail ?If you have any lab test that is abnormal or we need to change your treatment, we will call you to review the results. ? ? ?Testing/Procedures: ?NONE ? ? ?Follow-Up: As needed ?At [facility], you and your health needs are our priority.  As part of our continuing mission to provide you with exceptional heart care, we have created designated Provider Care Teams.  These Care Teams include your primary Cardiologist (physician) and Advanced Practice Providers (APPs -  Physician Assistants and Nurse Practitioners) who all work together to provide you with the care you need, when you need it. ? ?We recommend signing up for the patient portal called "MyChart".  Sign up information is provided on this After Visit Summary.  MyChart is used to connect with patients for Virtual Visits (Telemedicine).  Patients are able to view lab/test results, encounter notes, upcoming appointments, etc.  Non-urgent messages can be sent to your provider as well.   ?To learn more about what you can do with MyChart, go to [email or web address].   ? ? ? ?Provider:   ?Kristeen Miss, MD   ? ? ?

## 2022-04-03 ENCOUNTER — Ambulatory Visit: Payer: 59 | Admitting: Family Medicine

## 2022-04-03 ENCOUNTER — Ambulatory Visit: Payer: 59 | Admitting: Neurology

## 2022-05-23 ENCOUNTER — Encounter: Payer: Self-pay | Admitting: Family Medicine

## 2022-05-23 ENCOUNTER — Ambulatory Visit: Payer: Self-pay

## 2022-05-23 ENCOUNTER — Ambulatory Visit (INDEPENDENT_AMBULATORY_CARE_PROVIDER_SITE_OTHER): Payer: 59 | Admitting: Family Medicine

## 2022-05-23 VITALS — BP 118/90 | Ht 68.0 in | Wt 141.0 lb

## 2022-05-23 DIAGNOSIS — M23204 Derangement of unspecified medial meniscus due to old tear or injury, left knee: Secondary | ICD-10-CM | POA: Diagnosis not present

## 2022-05-23 DIAGNOSIS — M25562 Pain in left knee: Secondary | ICD-10-CM

## 2022-05-23 NOTE — Assessment & Plan Note (Signed)
Acutely occurring.  Having mild degenerative change of the medial meniscus with associated effusion and Baker's cyst. -Counseled on home exercise therapy and supportive care. -Counseled on compression. -Could consider injection or physical therapy.

## 2022-05-23 NOTE — Patient Instructions (Signed)
Nice to meet you Please use ice as needed  Please consider the compression Please try the exercises   Please send me a message in MyChart with any questions or updates.  Please see me back in 4 weeks or as needed if better.   --Dr. Jordan Likes

## 2022-05-23 NOTE — Progress Notes (Signed)
  Ronald Chapman - 56 y.o. male MRN [id number]  Date of birth: 12-10-1965  SUBJECTIVE:  Including CC & ROS.  No chief complaint on file.   Ronald Chapman is a 56 y.o. male that is presenting with left knee pain and fullness in the posterior aspect of the knee.  No specific injury.  Did try an episode of running.  Works out on a regular basis.  No history of surgery.   Review of Systems See HPI   HISTORY: Past Medical, Surgical, Social, and Family History Reviewed & Updated per EMR.   Pertinent Historical Findings include:  Past Medical History:  Diagnosis Date   Cervicalgia    GERD (gastroesophageal reflux disease)    Myoclonus    Sleep apnea    Trigeminal neuralgia     Past Surgical History:  Procedure Laterality Date   appendectomy     COLONOSCOPY  2020   HERNIA REPAIR     nose sugery     NOSE SURGERY   age 28     PHYSICAL EXAM:  VS: BP 118/90 (BP Location: Left Arm, Patient Position: Sitting)   Ht 5\' 8"  (1.727 m)   Wt 141 lb (64 kg)   BMI 21.44 kg/m  Physical Exam Gen: NAD, alert, cooperative with exam, well-appearing MSK:  Neurovascularly intact    Limited ultrasound: Left knee:  Mild effusion suprapatellar pouch. Normal-appearing quadricep and patellar tendon. Tear of the medial meniscus appreciated. Normal-appearing lateral meniscus. Small Baker's cyst appreciated  Summary: Degenerative meniscal tear and effusion with Baker's cyst.  Ultrasound and interpretation by , MD    ASSESSMENT & PLAN:   Degenerative tear of medial meniscus of left knee Acutely occurring.  Having mild degenerative change of the medial meniscus with associated effusion and Baker's cyst. -Counseled on home exercise therapy and supportive care. -Counseled on compression. -Could consider injection or physical therapy.

## 2022-06-18 ENCOUNTER — Ambulatory Visit (INDEPENDENT_AMBULATORY_CARE_PROVIDER_SITE_OTHER): Payer: 59 | Admitting: Family Medicine

## 2022-06-18 ENCOUNTER — Encounter: Payer: Self-pay | Admitting: Family Medicine

## 2022-06-18 VITALS — BP 136/86 | Ht 68.0 in | Wt 141.0 lb

## 2022-06-18 DIAGNOSIS — M23204 Derangement of unspecified medial meniscus due to old tear or injury, left knee: Secondary | ICD-10-CM

## 2022-06-18 MED ORDER — PREDNISONE 5 MG PO TABS: ORAL_TABLET | ORAL | 0 refills | Status: DC

## 2022-06-18 NOTE — Progress Notes (Signed)
  Ronald Chapman - 56 y.o. male MRN [id number]  Date of birth: Mar 28, 1966  SUBJECTIVE:  Including CC & ROS.  No chief complaint on file.   Ronald Chapman is a 56 y.o. male that is following up for his left knee pain.  He continues to have pain.  He does not feel completely normal with his current modalities of treatment.    Review of Systems See HPI   HISTORY: Past Medical, Surgical, Social, and Family History Reviewed & Updated per EMR.   Pertinent Historical Findings include:  Past Medical History:  Diagnosis Date   Cervicalgia    GERD (gastroesophageal reflux disease)    Myoclonus    Sleep apnea    Trigeminal neuralgia     Past Surgical History:  Procedure Laterality Date   appendectomy     COLONOSCOPY  2020   HERNIA REPAIR     nose sugery     NOSE SURGERY   age 41     PHYSICAL EXAM:  VS: BP 136/86 (BP Location: Left Arm, Patient Position: Sitting)   Ht 5\' 8"  (1.727 m)   Wt 141 lb (64 kg)   BMI 21.44 kg/m  Physical Exam Gen: NAD, alert, cooperative with exam, well-appearing MSK:  Neurovascularly intact       ASSESSMENT & PLAN:   Degenerative tear of medial meniscus of left knee Still having ongoing pain.  Not completely improved with current modalities. -Counseled on home exercise therapy and supportive care. -Prednisone. -Could consider injection or further imaging.

## 2022-06-18 NOTE — Assessment & Plan Note (Signed)
Still having ongoing pain.  Not completely improved with current modalities. -Counseled on home exercise therapy and supportive care. -Prednisone. -Could consider injection or further imaging.

## 2022-07-11 NOTE — Progress Notes (Signed)
Chief Complaint  Patient presents with   Follow-up    Rm 15. Alone. Pt states trigeminal neuralgia pain is stable.    HISTORY OF PRESENT ILLNESS:  07/15/22 ALL:  Ronald Chapman is a 56 y.o. male here today for follow up for trigeminal neuralgia. He continues oxcarb 600mg  TID. He feels facial pain is stable. He has more of a numbness around the right gumline and feels a pinching sensation of the right cheek. He does have rare sharp pains but feels pain is stable. He has been on this dose for 18-20 years. MRI showed vascular loop pressing on trigeminal nerve. He saw Dr Zada Finders. He is not comfortable proceeding with surgery at this time.   HISTORY (copied from Dr Cathren Laine previous note)  HPI:  Ronald Chapman is a 56 y.o. male here as requested by [name], [name], DO for numbness and pain right side of face/trigeminal neuralgia. PMHx sleep apnea, myoclonus, cervicalgia, trigeminal neuralgia. TGN on the right, started over 10 years ago, went to the dentist many times, prior to the trileptal he was having stabbing/shooting pain, severe, unbearable, mostly in V3 branch, medicine has controlled that mostly, he may still have some stabbing pain that will come through, he has constant numbness in the same area, annoying not painful. Never had any procedures completed on the nerve. He used to clench a lot on the right side, grinding teeth, doesn't do that anymore, wore a mouth guard for a while. Triggers of the trigger pains would be cold temperatures, he has been drinking through a straw, it has been a long time and he can't really recall other triggers. Now much welel controlled on medication with some breakthrough pain sometime and numbness. Stress makes it worse, he is a "high stress" person. He has Gabapentin prn. Been on trileptal for years. He has been having new symptoms more on the right and on both sides. Pulsatile tinnitus, fairly all the time. Can be severe at night worse when laying down,  can be very loud.   Reviewed notes, labs and imaging from outside physicians, which showed:   08/07/2021: CMP with Na 132 otherwise unremarkable  I reviewed Dr. Jackalyn Lombard notes: Patient has a history of trigeminal neuralgia, atypical facial pain when the patient sees neurology and had a nonspecific MRI of the brain diagnosed with trigeminal neuralgia and currently controlled on Trileptal, neurologist has retired and he would like to get a second opinion, bilateral tinnitus was due for MRI, he also has a fracture at C7 and the left lamina and the articular pillar after skiing accident in 2011 treated with a neck brace for 3 months without any issues, has mild sleep apnea, tried CPAP machine but could not tolerate it.  I was able to locate neurology notes he was last seen in November 2021 by Dr. Sabra Heck at [facility] neurolog[facility] and sleep, intermittent jerks of the extremities, 20-30 times a day, he is on Trileptal 150 mg to 3 times daily, occasionally dizziness, pressure back of head lasting an hour not overwhelming, some neck pain, Trileptal has been associated with hyponatremia he was advised to increase his salt intake, lots of stress, in the setting of separation of his wife and illness and his pet.  It appears he had been being seen multiple times, he had an episode of passing out and could not talk felt fine after 30 minutes and given sugar, the twitching is slightly myoclonic-like movement more in the left, all appearing to be stress  related.  He has been seen in the past with intermittent right facial pain that has been static for several years, the stabbing pain in the face is less responsive to the Trileptal as of late, MRI of the head was last done February 2006 and was normal (by report), lots of stress, impatience, intolerance, inability to focus.  He also fell while skiing and he fractured his C7 seen by neurosurgery in [place] this was in 2011 February, numbing pain in the right face to the  infraorbital and right area, near the eye, rates it 1-2 out of 10 in pain, no other health issues.  He has reported stabbing shooting pains in the area of the trigeminal nerve in the past with numbness, he also grinds his teeth.  I reviewed notes going back to 2016.  Neurologic exam was normal.   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 Prominent  vascular loop at the root entry zone of the right fifth nerve. There  could be vascular etiology of the right trigeminal neuralgia. MR  angiography suggested for more complete evaluation. (Dr. Karin Golden)   REVIEW OF SYSTEMS: Out of a complete 14 system review of symptoms, the patient complains only of the following symptoms, facial pain and paresthesias and all other reviewed systems are negative.   ALLERGIES: No Known Allergies   HOME MEDICATIONS: Outpatient Medications Prior to Visit  Medication Sig Dispense Refill   triamcinolone cream (KENALOG) 0.1 % Apply 1 application topically 2 (two) times daily as needed.     Oxcarbazepine (TRILEPTAL) 300 MG tablet Take 2 tablets (600 mg total) by mouth 3 (three) times daily. 540 tablet 4   gabapentin (NEURONTIN) 100 MG capsule Take 100 mg by mouth as needed. (Patient not taking: Reported on 07/15/2022)     predniSONE (DELTASONE) 5 MG tablet Take 6 pills for first day, 5 pills second day, 4 pills third day, 3 pills fourth day, 2 pills the fifth day, and 1 pill sixth day. 21 tablet 0   No facility-administered medications prior to visit.     PAST MEDICAL HISTORY: Past Medical History:  Diagnosis Date   Cervicalgia    GERD (gastroesophageal reflux disease)    Myoclonus    Sleep apnea    Trigeminal neuralgia       PAST SURGICAL HISTORY: Past Surgical History:  Procedure Laterality Date   appendectomy     COLONOSCOPY  2020   HERNIA REPAIR     nose sugery     NOSE SURGERY   age 80     FAMILY HISTORY: Family History  Problem Relation Age of Onset   Heart disease Maternal Grandfather 25   Headache Neg Hx      SOCIAL HISTORY: Social History   Socioeconomic History   Marital status: Married    Spouse name: Not on file   Number of children: Not on file   Years of education: Not on file   Highest education level: Not on file  Occupational History   Occupation: IT trainer  Tobacco Use   Smoking status: Never   Smokeless tobacco: Never  Substance and Sexual Activity   Alcohol use: Yes    Alcohol/week: 10.0 standard drinks of alcohol    Types: 10 Glasses of wine per week    Comment:  occasional   Drug use: No   Sexual activity: Not on file  Other Topics Concern   Not on file  Social History Narrative   Lives at home with wife.    Social Determinants of Health   Financial Resource Strain: Not on file  Food Insecurity: Not on file  Transportation Needs: Not on file  Physical Activity: Not on file  Stress: Not on file  Social Connections: Not on file  Intimate Partner Violence: Not on file     PHYSICAL EXAM  Vitals:   07/15/22 1406  BP: 118/76  Pulse: 68  Weight: 148 lb (67.1 kg)  Height: 5\' 8"  (1.727 m)   Body mass index is 22.5 kg/m.  Generalized: Well developed, in no acute distress  Cardiology: normal rate and rhythm, no murmur auscultated  Respiratory: clear to auscultation bilaterally    Neurological examination  Mentation: Alert oriented to time, place, history taking. Follows all commands speech and language fluent Cranial nerve II-XII: Pupils were equal round reactive to light. Extraocular movements were full, visual field were full on confrontational test. Facial sensation and strength were normal. Head turning and shoulder shrug  were normal and  symmetric. Motor: The motor testing reveals 5 over 5 strength of all 4 extremities. Good symmetric motor tone is noted throughout.  Gait and station: Gait is normal.    DIAGNOSTIC DATA (LABS, IMAGING, TESTING) - I reviewed patient records, labs, notes, testing and imaging myself where available.  Lab Results  Component Value Date   WBC 5.3 11/23/2016   HGB 13.9 11/23/2016   HCT 39.4 11/23/2016   MCV 94.3 11/23/2016   PLT 168 11/23/2016      Component Value Date/Time   NA 133 (L) 11/23/2016 2148   K 3.5 11/23/2016 2148   CL 97 (L) 11/23/2016 2148   CO2 30 11/23/2016 2148   GLUCOSE 111 (H) 11/23/2016 2148   BUN 17 11/23/2016 2148   CREATININE 0.89 11/23/2016 2148   CALCIUM 9.0 11/23/2016 2148   PROT 7.0 07/16/2015 2152   ALBUMIN 4.6 07/16/2015 2152   AST 46 (H) 07/16/2015 2152   ALT 25 07/16/2015 2152   ALKPHOS 62 07/16/2015 2152   BILITOT 0.2 (L) 07/16/2015 2152   GFRNONAA >60 11/23/2016 2148   GFRAA >60 11/23/2016 2148   No results found for: "CHOL", "HDL", "LDLCALC", "LDLDIRECT", "TRIG", "CHOLHDL" No results found for: "HGBA1C" No results found for: "VITAMINB12" No results found for: "TSH"      No data to display               No data to display           ASSESSMENT AND PLAN  56 y.o. year old male  has a past medical history of Cervicalgia, GERD (gastroesophageal reflux disease), Myoclonus, Sleep apnea, and Trigeminal neuralgia. here with    Trigeminal neuralgia of right side of face - Plan: 10-Hydroxycarbazepine, CMP  Ronald Chapman reports symptoms are well managed on oxcarbazepine 600mg  TID. He is tolerating dose well without obvious adverse effects. We will update labs. Will consider weaning dose in the future. We have discussed possible side effects and s/s of hyponatremia. He is not interested in decompression at this time.  Healthy lifestyle habits encouraged. He will follow up with PCP as directed. He will return to see me in 1 year, sooner if  needed. He verbalizes understanding and agreement with this plan.   Orders Placed This Encounter  Procedures   10-Hydroxycarbazepine   CMP  Meds ordered this encounter  Medications   Oxcarbazepine (TRILEPTAL) 300 MG tablet    Sig: Take 2 tablets (600 mg total) by mouth 3 (three) times daily.    Dispense:  540 tablet    Refill:  3    Order Specific Question:   Supervising Provider    Answer:   Melvenia Beam [id number]     Debbora Presto, MSN, FNP-C 07/15/2022, 3:50 PM  [facility] [address] [place], [place] [address] [phone]

## 2022-07-11 NOTE — Patient Instructions (Signed)
Below is our plan:  We will continue oxcarbazepine 600mg three times daily.   Please make sure you are staying well hydrated. I recommend 50-60 ounces daily. Well balanced diet and regular exercise encouraged. Consistent sleep schedule with 6-8 hours recommended.   Please continue follow up with care team as directed.   Follow up with me in 1 year   You may receive a survey regarding today's visit. I encourage you to leave honest feed back as I do use this information to improve patient care. Thank you for seeing me today!    

## 2022-07-15 ENCOUNTER — Encounter: Payer: Self-pay | Admitting: Family Medicine

## 2022-07-15 ENCOUNTER — Ambulatory Visit (INDEPENDENT_AMBULATORY_CARE_PROVIDER_SITE_OTHER): Payer: 59 | Admitting: Family Medicine

## 2022-07-15 VITALS — BP 118/76 | HR 68 | Ht 68.0 in | Wt 148.0 lb

## 2022-07-15 DIAGNOSIS — G500 Trigeminal neuralgia: Secondary | ICD-10-CM

## 2022-07-15 MED ORDER — OXCARBAZEPINE 300 MG PO TABS: 600.0000 mg | ORAL_TABLET | Freq: Three times a day (TID) | ORAL | 3 refills | Status: DC

## 2022-07-16 LAB — COMPREHENSIVE METABOLIC PANEL
ALT: 18 IU/L (ref 0–44)
Albumin/Globulin Ratio: 2.5 — ABNORMAL HIGH (ref 1.2–2.2)
Albumin: 4.5 g/dL (ref 3.8–4.9)
eGFR: 52 mL/min/{1.73_m2} — ABNORMAL LOW (ref >59)

## 2022-07-17 LAB — COMPREHENSIVE METABOLIC PANEL
AST: 25 IU/L (ref 0–40)
Alkaline Phosphatase: 84 IU/L (ref 44–121)
BUN/Creatinine Ratio: 16 (ref 9–20)
BUN: 25 mg/dL — ABNORMAL HIGH (ref 6–24)
Bilirubin Total: 0.2 mg/dL (ref 0.0–1.2)
CO2: 24 mmol/L (ref 20–29)
Calcium: 9 mg/dL (ref 8.7–10.2)
Chloride: 94 mmol/L — ABNORMAL LOW (ref 96–106)
Creatinine, Ser: 1.57 mg/dL — ABNORMAL HIGH (ref 0.76–1.27)
Globulin, Total: 1.8 g/dL (ref 1.5–4.5)
Glucose: 100 mg/dL — ABNORMAL HIGH (ref 70–99)
Potassium: 4.5 mmol/L (ref 3.5–5.2)
Sodium: 133 mmol/L — ABNORMAL LOW (ref 134–144)
Total Protein: 6.3 g/dL (ref 6.0–8.5)

## 2022-07-17 LAB — 10-HYDROXYCARBAZEPINE: Oxcarbazepine SerPl-Mcnc: 24 ug/mL (ref 10–35)

## 2022-12-11 ENCOUNTER — Other Ambulatory Visit: Payer: Self-pay | Admitting: Neurology

## 2023-01-27 NOTE — Progress Notes (Unsigned)
PATIENT: Ronald Chapman DOB: [birth year]  REASON FOR VISIT: follow up HISTORY FROM: patient  Virtual Visit via Telephone Note  I connected with Kennon Holter on 01/28/23 at  8:45 AM EST by telephone and verified that I am speaking with the correct person using two identifiers.   I discussed the limitations, risks, security and privacy concerns of performing an evaluation and management service by telephone and the availability of in person appointments. I also discussed with the patient that there may be a patient responsible charge related to this service. The patient expressed understanding and agreed to proceed.   History of Present Illness:  01/28/23 ALL (Mychart): Ronald Chapman is a 57 y.o. male here today for follow up for TGN. He continues oxcarbazepine 656m TID. He reports facial pain is well managed. He has not used gabapentin recently. No OTC analgesics. Na was 133 at CPE in 08/2022. He drinks an electrolyte drink daily. He works out daily.   07/15/22 ALL:  Ronald LOLLIEis a 57y.o. male here today for follow up for trigeminal neuralgia. He continues oxcarb 606mTID. He feels facial pain is stable. He has more of a numbness around the right gumline and feels a pinching sensation of the right cheek. He does have rare sharp pains but feels pain is stable. He has been on this dose for 18-20 years. MRI showed vascular loop pressing on trigeminal nerve. He saw Dr OsZada FindersHe is not comfortable proceeding with surgery at this time.   HISTORY (copied from Dr AhCathren Lainerevious note)  HPI:  Ronald MELLEYs a [age].o. male here as requested by [name], [name], DO for numbness and pain right side of face/trigeminal neuralgia. PMHx sleep apnea, myoclonus, cervicalgia, trigeminal neuralgia. TGN on the right, started over 10 years ago, went to the dentist many times, prior to the trileptal he was having stabbing/shooting pain, severe, unbearable, mostly in V3 branch, medicine  has controlled that mostly, he may still have some stabbing pain that will come through, he has constant numbness in the same area, annoying not painful. Never had any procedures completed on the nerve. He used to clench a lot on the right side, grinding teeth, doesn't do that anymore, wore a mouth guard for a while. Triggers of the trigger pains would be cold temperatures, he has been drinking through a straw, it has been a long time and he can't really recall other triggers. Now much welel controlled on medication with some breakthrough pain sometime and numbness. Stress makes it worse, he is a "high stress" person. He has Gabapentin prn. Been on trileptal for years. He has been having new symptoms more on the right and on both sides. Pulsatile tinnitus, fairly all the time. Can be severe at night worse when laying down, can be very loud.   Reviewed notes, labs and imaging from outside physicians, which showed:   08/07/2021: CMP with Na 132 otherwise unremarkable  I reviewed Dr. LaJackalyn Lombardotes: Patient has a history of trigeminal neuralgia, atypical facial pain when the patient sees neurology and had a nonspecific MRI of the brain diagnosed with trigeminal neuralgia and currently controlled on Trileptal, neurologist has retired and he would like to get a second opinion, bilateral tinnitus was due for MRI, he also has a fracture at C7 and the left lamina and the articular pillar after skiing accident in 2011 treated with a neck brace for 3 months without any issues, has mild sleep apnea, tried  CPAP machine but could not tolerate it.  I was able to locate neurology notes he was last seen in November 2021 by Dr. Sabra Heck at [facility] neurology and sleep, intermittent jerks of the extremities, 20-30 times a day, he is on Trileptal 150 mg to 3 times daily, occasionally dizziness, pressure back of head lasting an hour not overwhelming, some neck pain, Trileptal has been associated with hyponatremia he was advised to  increase his salt intake, lots of stress, in the setting of separation of his wife and illness and his pet.  It appears he had been being seen multiple times, he had an episode of passing out and could not talk felt fine after 30 minutes and given sugar, the twitching is slightly myoclonic-like movement more in the left, all appearing to be stress related.  He has been seen in the past with intermittent right facial pain that has been static for several years, the stabbing pain in the face is less responsive to the Trileptal as of late, MRI of the head was last done February 2006 and was normal (by report), lots of stress, impatience, intolerance, inability to focus.  He also fell while skiing and he fractured his C7 seen by neurosurgery in [place] this was in 2011 February, numbing pain in the right face to the infraorbital and right area, near the eye, rates it 1-2 out of 10 in pain, no other health issues.  He has reported stabbing shooting pains in the area of the trigeminal nerve in the past with numbness, he also grinds his teeth.  I reviewed notes going back to 2016.  Neurologic exam was normal.  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 Prominent  vascular loop at the root entry zone of the right fifth nerve. There  could be vascular etiology of the right trigeminal neuralgia. MR  angiography suggested for more complete evaluation. (Dr. Maree Erie)   Observations/Objective:  Generalized: Well developed, in no acute distress  Mentation: Alert oriented to time, place, history taking. Follows all commands speech and language fluent   Assessment and Plan:  57 y.o. year old male  has a past medical history of  Cervicalgia, GERD (gastroesophageal reflux disease), Myoclonus, Sleep apnea, and Trigeminal neuralgia. here with    ICD-10-CM   1. Trigeminal neuralgia of right side of face  G50.0     2. 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 Facial pain well managed. We will continue oxcarbazepine 698m TID. We have discussed need for close lab monitoring. Signs and symptoms of hyponatremia reviewed. He is followed regularly by PCP. Healthy lifestyle habits encouraged. He will return to see me in 1 year, sooner if needed.    No orders of the defined types were placed in this encounter.   Meds ordered this encounter  Medications   Oxcarbazepine (TRILEPTAL) 300 MG tablet    Sig: Take 2 tablets (600 mg total) by mouth 3 (three) times daily.    Dispense:  540 tablet    Refill:  3    Order Specific Question:   Supervising Provider    Answer:   AMelvenia Beam[[id number]    Follow Up Instructions:  I discussed the assessment and treatment plan with the patient.  The patient was provided an opportunity to ask questions and all were answered. The patient agreed with the plan and demonstrated an understanding of the instructions.   The patient was advised to call back or seek an in-person evaluation if the symptoms worsen or if the condition fails to improve as anticipated.  I provided 15 minutes of non-face-to-face time during this encounter. Patient located at their place of residence during [place] visit. Provider is in the office.    Debbora Presto, NP

## 2023-01-27 NOTE — Patient Instructions (Signed)
Below is our plan:  We will continue oxcarbazepine 672m three times daily.   Please make sure you are staying well hydrated. I recommend 50-60 ounces daily. Well balanced diet and regular exercise encouraged. Consistent sleep schedule with 6-8 hours recommended.   Please continue follow up with care team as directed.   Follow up with me in 1 year   You may receive a survey regarding today's visit. I encourage you to leave honest feed back as I do use this information to improve patient care. Thank you for seeing me today!

## 2023-01-28 ENCOUNTER — Telehealth (INDEPENDENT_AMBULATORY_CARE_PROVIDER_SITE_OTHER): Payer: 59 | Admitting: Family Medicine

## 2023-01-28 ENCOUNTER — Encounter: Payer: Self-pay | Admitting: Family Medicine

## 2023-01-28 DIAGNOSIS — G501 Atypical facial pain: Secondary | ICD-10-CM | POA: Diagnosis not present

## 2023-01-28 DIAGNOSIS — G500 Trigeminal neuralgia: Secondary | ICD-10-CM | POA: Diagnosis not present

## 2023-01-28 MED ORDER — OXCARBAZEPINE 300 MG PO TABS: 600.0000 mg | ORAL_TABLET | Freq: Three times a day (TID) | ORAL | 3 refills | Status: DC

## 2023-03-17 ENCOUNTER — Other Ambulatory Visit: Payer: Self-pay | Admitting: Neurology

## 2023-03-24 ENCOUNTER — Encounter: Payer: Self-pay | Admitting: *Deleted

## 2023-06-13 ENCOUNTER — Emergency Department (HOSPITAL_BASED_OUTPATIENT_CLINIC_OR_DEPARTMENT_OTHER): Payer: 59

## 2023-06-13 ENCOUNTER — Encounter (HOSPITAL_BASED_OUTPATIENT_CLINIC_OR_DEPARTMENT_OTHER): Payer: Self-pay

## 2023-06-13 ENCOUNTER — Other Ambulatory Visit: Payer: Self-pay

## 2023-06-13 ENCOUNTER — Emergency Department (HOSPITAL_BASED_OUTPATIENT_CLINIC_OR_DEPARTMENT_OTHER)
Admission: EM | Admit: 2023-06-13 | Discharge: 2023-06-13 | Disposition: A | Discharge status: Home / Self Care | Payer: 59 | Attending: Emergency Medicine | Admitting: Emergency Medicine

## 2023-06-13 DIAGNOSIS — X500XXA Overexertion from strenuous movement or load, initial encounter: Secondary | ICD-10-CM | POA: Diagnosis not present

## 2023-06-13 DIAGNOSIS — S29001A Unspecified injury of muscle and tendon of front wall of thorax, initial encounter: Secondary | ICD-10-CM | POA: Diagnosis present

## 2023-06-13 DIAGNOSIS — S43204A Unspecified dislocation of right sternoclavicular joint, initial encounter: Secondary | ICD-10-CM | POA: Diagnosis not present

## 2023-06-13 DIAGNOSIS — S43101A Unspecified dislocation of right acromioclavicular joint, initial encounter: Secondary | ICD-10-CM

## 2023-06-13 NOTE — ED Notes (Signed)
Pt in bed, pt states that he is ready to go home, pt verbalized understanding d/c and follow up, advised to return for any concerns pt ambulatory from department.

## 2023-06-13 NOTE — ED Provider Notes (Signed)
[place] EMERGENCY DEPARTMENT AT [facility] Provider Note   CSN: [id number] Arrival date & time: 06/13/23  9562     History  Chief Complaint  Patient presents with   Rib Injury    Ronald Chapman is a 57 y.o. male with past medical history significant for trigeminal neuralgia, civicalgia, GERD presents [facility] scan of dislocated right clavicle.  Patient states that he initially injured his clavicle 1 month prior, but a couple days ago, he was doing some heavy lifting and began having increased discomfort.  Patient states he is also had a uncomfortable sensation in his throat whenever he swallows or breathes.  He also has radiation of his clavicular pain to the back of his neck.  Denies numbness or tingling, shortness of breath, chest pain, weakness of upper extremities.      Home Medications Prior to Admission medications   Medication Sig Start Date End Date Taking? Authorizing Provider  gabapentin (NEURONTIN) 100 MG capsule Take 100 mg by mouth as needed. Patient not taking: Reported on 07/15/2022 11/12/18   [provider]  Oxcarbazepine (TRILEPTAL) 300 MG tablet TAKE TWO TABLETS BY MOUTH 3 TIMES DAILY 03/24/23   Lomax, Amy, NP  triamcinolone cream (KENALOG) 0.1 % Apply 1 application topically 2 (two) times daily as needed. 01/08/22   [provider]      Allergies    Patient has no known allergies.    Review of Systems   Review of Systems  Musculoskeletal:  Positive for arthralgias (R clavicle) and neck pain. Negative for neck stiffness.  Neurological:  Negative for weakness and numbness.    Physical Exam Updated Vital Signs BP (!) 146/91 (BP Location: Left Arm)   Pulse (!) 58   Temp 98 F (36.7 C) (Oral)   Resp 17   Ht 5\' 8"  (1.727 m)   Wt 65.8 kg   SpO2 100%   BMI 22.05 kg/m  Physical Exam Vitals and nursing note reviewed.  Constitutional:      General: He is not in acute distress.    Appearance: Normal  appearance. He is not ill-appearing or diaphoretic.  Cardiovascular:     Rate and Rhythm: Normal rate and regular rhythm.  Pulmonary:     Effort: Pulmonary effort is normal.     Breath sounds: Normal breath sounds.  Chest:     Chest wall: Deformity (over the R clavicle) present. No swelling, tenderness or crepitus.  Musculoskeletal:     Comments: Patient able to move upper extremities with normal range of motion.  Neurological:     Mental Status: He is alert. Mental status is at baseline.  Psychiatric:        Mood and Affect: Mood normal.        Behavior: Behavior normal.     ED Results / Procedures / Treatments   Labs (all labs ordered are listed, but only abnormal results are displayed) Labs Reviewed - No data to display  EKG None  Radiology CT Chest Wo Contrast  Result Date: 06/13/2023 CLINICAL DATA:  Blunt trauma.  Clavicle dislocation. EXAM: CT CHEST WITHOUT CONTRAST TECHNIQUE: Multidetector CT imaging of the chest was performed following the standard protocol without IV contrast. RADIATION DOSE REDUCTION: This exam was performed according to the departmental dose-optimization program which includes automated exposure control, adjustment of the mA and/or kV according to patient size and/or use of iterative reconstruction technique. COMPARISON:  None Available. FINDINGS: Cardiovascular: On this non IV contrast  exam, the heart is nonenlarged. No significant pericardial effusion. There is some coronary artery calcifications. Please correlate for other coronary risk factors. Grossly the thoracic aorta has a normal course and caliber. Mediastinum/Nodes: Normal caliber thoracic esophagus which is slightly patulous. No specific abnormal lymph node enlargement seen on this noncontrast examination in the axillary regions, hilum or mediastinum. Lungs/Pleura: Slight atelectasis in the lung bases. No consolidation, pneumothorax or effusion. There is a 2 mm nodule left lower lobe on series 4,  image 128. Upper Abdomen: Adrenal glands are preserved in the upper abdomen. Musculoskeletal: Mild degenerative changes seen along the thoracic spine. For the imaging the left arm was scanned elevated compared to right. The asymmetry of the pectoralis muscles is likely related to patient positioning. There is degenerative changes identified along the right sternoclavicular joint with sclerosis and subchondral cyst formation. There is some asymmetry of this joint in terms of orientation compared to the opposite side. This very well could be related to the degenerative change. No surrounding hematoma or stranding. IMPRESSION: Asymmetric appearance of the right sternoclavicular joint compared to left. There is a associated moderate degenerative changes of the joint space with subchondral cyst formation, sclerosis. The asymmetry of the joint space very well could relate to the associated degenerative changes or than traumas there is no inflammatory stranding or hematoma. No fracture. Please correlate with clinical findings. Tiny lung nodule. No follow-up needed if patient is low-risk.This recommendation follows the consensus statement: Guidelines for Management of Incidental Pulmonary Nodules Detected on CT Images: From the Fleischner Society 2017; Radiology 2017; 284:228-243. Coronary artery calcifications. Please correlate for other risk factors Aortic Atherosclerosis (ICD10-I70.0). Electronically Signed   By: Karen Kays M.D.   On: 06/13/2023 10:49    Procedures Procedures    Medications Ordered in ED Medications - No data to display  ED Course/ Medical Decision Making/ A&P                             Medical Decision Making Amount and/or Complexity of Data Reviewed Radiology: ordered.   This patient presents to the ED with chief complaint(s) of right clavicle discomfort and dislocation with noncontributory past medical history.  The complaint involves an extensive differential diagnosis and also  carries with it a high risk of complications and morbidity.    The differential diagnosis includes anterior or posterior dislocation of the right clavicle, acute fracture, impingement syndrome, bronchial injury, tracheal injury  Initial Assessment:   Exam significant for mild deformity over the right clavicle at the [facility] joint.  There is no crepitus or tenderness.  Pulmonary effort is normal with clear lungs bilaterally.  Patient does not appear to be in acute distress.  There are no overlying skin changes.  Imaging Studies: I ordered and personally interpreted CT scan of the chest with focus on the right clavicle.  There is asymmetric appearance of the right sternoclavicular joint when compared to the left.  There is also associated moderate degenerative changes of the joint space with subchondral cyst formation and sclerosis.  There is no evidence of fracture or inflammatory stranding.  There is also a 2 mm lung nodule on the left lower lobe.  I agree with radiologist interpretation.  Disposition:   Discussed imaging findings with patient and recommended that he continue to take anti-inflammatories and follow-up with his orthopedic provider.  I do not see emergent ortho need at this time.  Informed patient of incidental lung nodule finding  and recommended following up with his primary care provider regarding this.  The patient has been appropriately medically screened and/or stabilized in the ED. I have low suspicion for any other emergent medical condition which would require further screening, evaluation or treatment in the ED or require inpatient management. At time of discharge the patient is hemodynamically stable and in no acute distress. I have discussed work-up results and diagnosis with patient and answered all questions. Patient is agreeable with discharge plan. We discussed strict return precautions for returning to the emergency department and they verbalized understanding.             Final Clinical Impression(s) / ED Diagnoses Final diagnoses:  Closed dislocation of right clavicle, initial encounter    Rx / DC Orders ED Discharge Orders     None         Lenard Simmer, PA-C 06/13/23 1140    Sloan Leiter, DO 06/14/23 [phone]

## 2023-06-13 NOTE — ED Triage Notes (Signed)
Pt to er, pt states that [facility] just [facility] for a dis[facility] bone, states that a couple of days ago he was lifting and hurt it.  States that they sent him over for better pictures, and maybe a ct scan,  states that he also has some pain in his throat.

## 2023-06-13 NOTE — Discharge Instructions (Addendum)
Thank you for allowing [place] to be part of your care today.  You were evaluated in the ED for dislocation of your right clavicle.  Your CT scan showed asymmetry of your right [place] joint when compared to the left.  There was also degenerative changes of the joint space.  There is no evidence of fracture.  Your CT scan also showed an incidental finding of a small lung nodule.  You may follow-up with your primary care provider regarding this.  Please follow-up with your orthopedic provider regarding your CT scan results.  A copy of these results have been provided for you.  I recommend continue to take ibuprofen as needed.

## 2023-06-17 ENCOUNTER — Ambulatory Visit (INDEPENDENT_AMBULATORY_CARE_PROVIDER_SITE_OTHER): Payer: 59 | Admitting: Sports Medicine

## 2023-06-17 VITALS — BP 130/70 | Ht 68.0 in | Wt 145.0 lb

## 2023-06-17 DIAGNOSIS — M19011 Primary osteoarthritis, right shoulder: Secondary | ICD-10-CM

## 2023-06-17 DIAGNOSIS — M7522 Bicipital tendinitis, left shoulder: Secondary | ICD-10-CM | POA: Diagnosis not present

## 2023-06-17 NOTE — Progress Notes (Signed)
PCP: Vivi Ferns, [name], DO  Subjective:   HPI: Patient is a 57 y.o. male here for Follow-up from the ER.  Patient states that approximately 2 months ago he was doing a power cleaning when the bar hit him on the way up.  Patient states that at that time he went home and that his wife noticed that there was a abnormality near the [facility] joint area.  Patient states he did not think anything of it and he had some mild soreness that day but afterwards had no symptoms.  Patient states that approximately 5 days ago he had some difficulty with swallowing and then went to the ER.  Patient had a CT of his chest without contrast which did show asymmetric appearance of the right sternoclavicular joint compared to the left.  There is moderate degenerative changes of the joint space with subchondral cyst formation and sclerosis.  Patient states that he was told by the ER that he had a dislocation of his [place] joint area and that he should follow-up with the sports medicine clinic.  Patient is presenting today 5 days later.  Patient states that he has no pain of the associated area on his chest.  Patient denies any pain with any range of motion or any exercise.  Patient states that he is been exercising normally without any difficulty.  Patient does note that he has been dealing with some pain located at the distal end of his bicep on the left arm.  Patient states that whenever he does any sort of flexion or supination of the left bicep he has some pain.  Patient otherwise has no other concerns at this time. .     Objective:  Physical Exam:  Gen: NAD, comfortable in exam room  Bilateral Shoulder,: No TTP noted. No  skin changes, erythema, or ecchymosis noted. There is evidence of bony deformity and asymmetry noted at the right [place] joint junction. There is no tenderness over the right [place] joint or left [place] joint. No noted muscle atrophy; tenderness noted over the left distal biceps. There is pain of the distal bicep on  supination of the left arm. Full active and passive range of motion (180 flex Elisabeth Most /150Abd /90ER /70IR), Strength 5/5 throughout. No abnormal scapular function observed.     Assessment & Plan:  1. 1. Arthritis of right sternoclavicular joint   2. Biceps tendinitis of left upper extremity   -Reviewed patient's CT which does not show any concerns for posterior dislocation.  Patient is likely dealing with sternoclavicular arthritis of the right side.  At this time, no surgical intervention is required and no need for further follow up.  Given that patient is having some difficulty swallowing which is unrelated to the sternoclavicular joint, will hold off on anti-inflammatories.  Patient was advised to follow-up with his PCP regarding the difficulty swallowing.  If no concerns regarding the swelling patient can take anti-inflammatories as needed for the [facility] joint pain.   Given patient's physical exam which does show some tenderness in the distal bicep area as well as some pain on supination of the left arm.  Would recommend modifying workouts to avoid excessive stress on the biceps as patient is likely dealing with some biceps tendinitis.  Patient was advised to follow-up with PCP and to follow-up in sports clinic as needed  Patient seen and evaluated with the sports medicine fellow.  I agree with the above plan of care.  I personally reviewed the CT scan done in  the emergency room.  There is no evidence of [place] dislocation.  Certainly no concern for posterior dislocation.  CT findings show arthritic change in the right [place] joint which explains his swelling.  Physical exam is completely unremarkable in regards to SI joint pathology so I recommended no further workup or treatment here.  His difficulty swallowing needs to be evaluated by his PCP and possibly by gastroenterology. In regards to his left elbow distal biceps tendinitis, I recommended that he modify his lifts in the gym for the next 4 weeks  and avoid any exercise that involves supination or reproduces his symptoms.

## 2023-06-19 ENCOUNTER — Encounter: Payer: Self-pay | Admitting: Cardiovascular Disease

## 2023-06-19 NOTE — Progress Notes (Signed)
Cardiology Office Note:    Date:  06/20/2023   ID:  Ronald Chapman, DOB [birth year], MRN [id number]  PCP:  Mattie Marlin, DO  Cardiologist:  Kristeen Miss, MD  Electrophysiologist:  None   Referring MD: Mattie Marlin, DO   Chief Complaint  Patient presents with   Hyperlipidemia   Near Syncope    History of Present Illness:    Ronald DESTIN is a 57 y.o. male with a hx of hypertension.  Is recently been having some episodes of chest discomfort.  C/o tightness , radiation to left shoulder for past several months  Chest pain has worsened over the past several weeks.  Spontaneous,  Not related to exertion Fairly constant. Was riding indoor bike,  developed a sharp stabbing chest pain on the left side of his chest that lasted for less than a second.  He had 2 of these in a row.  He continued to ride at least 10-15 more minutes before completing his workout.  He did not have any recurrent episodes of chest discomfort.  Took a SL NTG this am and did not notice any difference Has been under lots of stress at home. 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  Sodium is 133.  Potassium is 4.4.  Glucose is 108.  Creatinine 0.9.  Liver enzymes are normal.     Total cholesterol is 195 Triglyceride level is 61 HDL is 77 LDL is 106  TSH is 1.37.  Dec. 6, 2022: Ronald Chapman is seen today for some recent syncope and presyncope Has had some lightheadedness after he works out very intensly.  We had him add electrolytes to his water.   Has orthostasis when he leans over to pick up something  Trileptal has dizziness listed as its first side effect.   February 06, 2022: Ronald Chapman is seen for follow up of his syncope and orthostatic hypotension   Started working out more again  - is working out in the am now  Still taking trileptal  for trigeminal neuralgia   Has lost 15 lbs,  has stopped alcohol.  Works out every day  Feeling great   June 20, 2023 Ronald Chapman is seen for follow up of his syncope and presyncope. Had some issues with his right sterno clavicular joint  [facility]  CT showed some coronary calcifications   No CP ,  works out 3 days a week , cardio + weights    Labs from [employer] system from September, 2023 reveal Total cholesterol is 161 Triglyceride level is 36 HDL is 74 LDL is 74  Fam Hx Mothers family    MGF- died of MI in his 61  Brother - died in 40s     Past Medical History:  Diagnosis Date   Cervicalgia    GERD (gastroesophageal reflux disease)    Myoclonus    Sleep apnea    Trigeminal neuralgia     Past Surgical History:  Procedure Laterality Date   appendectomy     COLONOSCOPY  2020   HERNIA REPAIR     nose sugery     NOSE SURGERY   age 27    Current Medications: Current Meds  Medication Sig   gabapentin (NEURONTIN) 100 MG capsule Take 100 mg by mouth as needed.   Oxcarbazepine (TRILEPTAL) 300 MG tablet TAKE TWO TABLETS BY MOUTH 3 TIMES DAILY   triamcinolone cream (KENALOG)  0.1 % Apply 1 application topically 2 (two) times daily as needed.     Allergies:   Patient has no known allergies.   Social History   Socioeconomic History   Marital status: Married    Spouse name: Not on file   Number of children: Not on file   Years of education: Not on file   Highest education level: Not on file  Occupational History   Occupation: IT trainer  Tobacco Use   Smoking status: Never   Smokeless tobacco: Never  Vaping Use   Vaping status: Never Used  Substance and Sexual Activity   Alcohol use: Yes    Alcohol/week: 10.0 standard drinks of alcohol    Types: 10 Glasses of wine per week    Comment: occasional   Drug use: No   Sexual activity: Not on file  Other Topics Concern   Not on file  Social History Narrative   Lives at home with wife.    Social Determinants of Health   Financial Resource Strain: Not on file  Food Insecurity: Low Risk  (06/16/2023)    [facility]   Food vital sign    Within the past 12 months, you worried that your food would run out before you got money to buy more: Never true    Within the past 12 months, the food you bought just didn't last and you didn't have money to get more. : Never true  Transportation Needs: Not on file (06/16/2023)  Physical Activity: Not on file  Stress: Not on file  Social Connections: Unknown (04/22/2022)   Received from [facility]   Social Network    Social Network: Not on file     Family History: The patient's family history includes Heart disease (age of onset: 74) in his maternal grandfather. There is no history of Headache.  ROS:   Please see the history of present illness.     All other systems reviewed and are negative.  EKGs/Labs/Other Studies Reviewed:     Recent Labs: 07/15/2022: ALT 18; BUN 25; Creatinine, Ser 1.57; Potassium 4.5; Sodium 133  Recent Lipid Panel No results found for: "CHOL", "TRIG", "HDL", "CHOLHDL", "VLDL", "LDLCALC", "LDLDIRECT"  Physical Exam:     Physical Exam: Blood pressure 128/80, pulse 77, height 5\' 8"  (1.727 m), weight 146 lb (66.2 kg), SpO2 98%.       GEN:  Well nourished, well developed in no acute distress HEENT: Normal NECK: No JVD; No carotid bruits LYMPHATICS: No lymphadenopathy CARDIAC: RRR , no murmurs, rubs, gallops RESPIRATORY:  Clear to auscultation without rales, wheezing or rhonchi  ABDOMEN: Soft, non-tender, non-distended MUSCULOSKELETAL:  No edema; No deformity  SKIN: Warm and dry NEUROLOGIC:  Alert and oriented x 3    EKG:   EKG Interpretation Date/Time:  Friday June 20 2023 08:15:54 EDT Ventricular Rate:  77 PR Interval:  150 QRS Duration:  98 QT Interval:  380 QTC Calculation: 430 R Axis:   92  Text Interpretation: Normal sinus rhythm Biatrial enlargement Rightward axis Pulmonary disease pattern RSR' or QR pattern in V1 suggests right ventricular conduction delay Septal infarct  , age undetermined When compared with ECG of 17-Jul-2015 18:24, PREVIOUS ECG IS PRESENT Confirmed by Kristeen Miss ([id number]) on 06/20/2023 8:20:12 AM      ASSESSMENT:    1. Trigeminal neuralgia   2. Orthostatic hypotension      PLAN:      Orthostatic hypotension: Recent episodes of orthostatic hypotension.  2.  Coronary  artery calcifications: He recently had a right clavicular sternal joint issue.  CT scan was performed and revealed some coronary artery calcifications.  He is here to discuss these calcifications.   He has a family history of premature coronary artery disease on his mother side.  His lipids are fairly well-controlled.  He is a non-smoker.  I would like to draw a lipoprotein a today.  Will get a coronary calcium score for further risk assessment.  If his calcium score is low and lipoprotein a is low then I do not think he needs any additional therapy.  He works out regularly and and is in good shape.  I will see him in 3 months for follow-up visit.     Medication Adjustments/Labs and Tests Ordered: Current medicines are reviewed at length with the patient today.  Concerns regarding medicines are outlined above.  Orders Placed This Encounter  Procedures   EKG 12-Lead    No orders of the defined types were placed in this encounter.   There are no Patient Instructions on file for this visit.   Signed, Kristeen Miss, MD  06/20/2023 8:20 AM    [facility]

## 2023-06-20 ENCOUNTER — Ambulatory Visit: Payer: 59 | Attending: Cardiovascular Disease | Admitting: Cardiovascular Disease

## 2023-06-20 ENCOUNTER — Encounter: Payer: Self-pay | Admitting: Cardiovascular Disease

## 2023-06-20 VITALS — BP 128/80 | HR 77 | Ht 68.0 in | Wt 146.0 lb

## 2023-06-20 DIAGNOSIS — G500 Trigeminal neuralgia: Secondary | ICD-10-CM | POA: Diagnosis not present

## 2023-06-20 DIAGNOSIS — I2510 Atherosclerotic heart disease of native coronary artery without angina pectoris: Secondary | ICD-10-CM | POA: Diagnosis not present

## 2023-06-20 DIAGNOSIS — I951 Orthostatic hypotension: Secondary | ICD-10-CM

## 2023-06-20 DIAGNOSIS — I2584 Coronary atherosclerosis due to calcified coronary lesion: Secondary | ICD-10-CM | POA: Diagnosis not present

## 2023-06-20 NOTE — Patient Instructions (Signed)
Medication Instructions:  Your physician recommends that you continue on your current medications as directed. Please refer to the Current Medication list given to you today.  *If you need a refill on your cardiac medications before your next appointment, please call your pharmacy*  Lab Work: Lipoprotein(a) today If you have labs (blood work) drawn today and your tests are completely normal, you will receive your results only by: MyChart Message (if you have MyChart) OR A paper copy in the mail If you have any lab test that is abnormal or we need to change your treatment, we will call you to review the results.  Testing/Procedures: Coronary Calcium Score CT Your physician has requested that you have cardiac CT. Cardiac computed tomography (CT) is a painless test that uses an x-ray machine to take clear, detailed pictures of your heart. For further information please visit [email or web address] Please follow instruction sheet as given.  Follow-Up: At [facility], you and your health needs are our priority.  As part of our continuing mission to provide you with exceptional heart care, we have created designated Provider Care Teams.  These Care Teams include your primary Cardiologist (physician) and Advanced Practice Providers (APPs -  Physician Assistants and Nurse Practitioners) who all work together to provide you with the care you need, when you need it.  Your next appointment:   3 month(s)  Provider:   Kristeen Miss, MD

## 2023-06-23 LAB — LIPOPROTEIN A (LPA): Lipoprotein (a): 22.8 nmol/L (ref <75.0)

## 2023-07-17 NOTE — Progress Notes (Signed)
Chief Complaint  Patient presents with   Room 1    Pt is here Alone. Pt states that things have been going good since his last appointment. Pt states he doesn't have any new symptoms or concerns to report today.     HISTORY OF PRESENT ILLNESS:  07/21/23 ALL:  Ronald Chapman returns for follow up for TGN. He continues oxcarb 600mg  TID. He feels he is doing well. He is tolerating med. Rare breakthrough pain. He has gabapentin 100mg  PRN for breakthrough pain but hasn't taken it. Pain usually resolves spontaneously within 24 hours. Stress is a trigger. He is seen annually by PCP. CPE labs unremarkable. Na 133 08/2022.   01/28/23 ALL (Mychart): Ronald Chapman is a 57 y.o. male here today for follow up for TGN. He continues oxcarbazepine 600mg  TID. He reports facial pain is well managed. He has not used gabapentin recently. No OTC analgesics. Na was 133 at CPE in 08/2022. He drinks an electrolyte drink daily. He works out daily.   07/15/2022 ALL: Ronald Chapman is a 57 y.o. male here today for follow up for trigeminal neuralgia. He continues oxcarb 600mg  TID. He feels facial pain is stable. He has more of a numbness around the right gumline and feels a pinching sensation of the right cheek. He does have rare sharp pains but feels pain is stable. He has been on this dose for 18-20 years. MRI showed vascular loop pressing on trigeminal nerve. He saw Dr Maurice Small. He is not comfortable proceeding with surgery at this time.   HISTORY (copied from Dr Trevor Mace previous note)  HPI:  Ronald Chapman is a 57 y.o. male here as requested by [name], [name], DO for numbness and pain right side of face/trigeminal neuralgia. PMHx sleep apnea, myoclonus, cervicalgia, trigeminal neuralgia. TGN on the right, started over 10 years ago, went to the dentist many times, prior to the trileptal he was having stabbing/shooting pain, severe, unbearable, mostly in V3 branch, medicine has controlled that mostly, he may still have  some stabbing pain that will come through, he has constant numbness in the same area, annoying not painful. Never had any procedures completed on the nerve. He used to clench a lot on the right side, grinding teeth, doesn't do that anymore, wore a mouth guard for a while. Triggers of the trigger pains would be cold temperatures, he has been drinking through a straw, it has been a long time and he can't really recall other triggers. Now much welel controlled on medication with some breakthrough pain sometime and numbness. Stress makes it worse, he is a "high stress" person. He has Gabapentin prn. Been on trileptal for years. He has been having new symptoms more on the right and on both sides. Pulsatile tinnitus, fairly all the time. Can be severe at night worse when laying down, can be very loud.   Reviewed notes, labs and imaging from outside physicians, which showed:   08/07/2021: CMP with Na 132 otherwise unremarkable  I reviewed Dr. Kennieth Francois notes: Patient has a history of trigeminal neuralgia, atypical facial pain when the patient sees neurology and had a nonspecific MRI of the brain diagnosed with trigeminal neuralgia and currently controlled on Trileptal, neurologist has retired and he would like to get a second opinion, bilateral tinnitus was due for MRI, he also has a fracture at C7 and the left lamina and the articular pillar after skiing accident in 2011 treated with a neck brace for 3 months without any  issues, has mild sleep apnea, tried CPAP machine but could not tolerate it.  I was able to locate neurology notes he was last seen in November 2021 by Dr. Hyacinth Meeker at [facility] neurology and sleep, intermittent jerks of the extremities, 20-30 times a day, he is on Trileptal 150 mg to 3 times daily, occasionally dizziness, pressure back of head lasting an hour not overwhelming, some neck pain, Trileptal has been associated with hyponatremia he was advised to increase his salt intake, lots of stress, in  the setting of separation of his wife and illness and his pet.  It appears he had been being seen multiple times, he had an episode of passing out and could not talk felt fine after 30 minutes and given sugar, the twitching is slightly myoclonic-like movement more in the left, all appearing to be stress related.  He has been seen in the past with intermittent right facial pain that has been static for several years, the stabbing pain in the face is less responsive to the Trileptal as of late, MRI of the head was last done February 2006 and was normal (by report), lots of stress, impatience, intolerance, inability to focus.  He also fell while skiing and he fractured his C7 seen by neurosurgery in [place] this was in 2011 February, numbing pain in the right face to the infraorbital and right area, near the eye, rates it 1-2 out of 10 in pain, no other health issues.  He has reported stabbing shooting pains in the area of the trigeminal nerve in the past with numbness, he also grinds his teeth.  I reviewed notes going back to 2016.  Neurologic exam was normal.   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 Prominent  vascular loop at the root entry zone of the right fifth nerve. There  could be vascular etiology of the right trigeminal neuralgia. MR  angiography suggested for more complete evaluation. (Dr. Karin Golden)   REVIEW OF SYSTEMS: Out of a complete 14 system review of symptoms, the patient complains only of the following symptoms, facial pain and paresthesias and all other reviewed systems are negative.   ALLERGIES: No Known Allergies   HOME MEDICATIONS: Outpatient Medications Prior to Visit  Medication Sig  Dispense Refill   gabapentin (NEURONTIN) 100 MG capsule Take 100 mg by mouth as needed.     Oxcarbazepine (TRILEPTAL) 300 MG tablet TAKE TWO TABLETS BY MOUTH 3 TIMES DAILY 540 tablet 3   triamcinolone cream (KENALOG) 0.1 % Apply 1 application topically 2 (two) times daily as needed. (Patient not taking: Reported on 07/21/2023)     No facility-administered medications prior to visit.     PAST MEDICAL HISTORY: Past Medical History:  Diagnosis Date   Cervicalgia    GERD (gastroesophageal reflux disease)    Myoclonus    Sleep apnea    Trigeminal neuralgia      PAST SURGICAL HISTORY: Past Surgical History:  Procedure Laterality Date   appendectomy     COLONOSCOPY  2020   HERNIA REPAIR     nose sugery     NOSE SURGERY   age 53     FAMILY HISTORY: Family History  Problem Relation Age of Onset   Heart disease Maternal Grandfather 72   Headache Neg Hx  SOCIAL HISTORY: Social History   Socioeconomic History   Marital status: Married    Spouse name: Not on file   Number of children: Not on file   Years of education: Not on file   Highest education level: Not on file  Occupational History   Occupation: IT trainer  Tobacco Use   Smoking status: Never   Smokeless tobacco: Never  Vaping Use   Vaping status: Never Used  Substance and Sexual Activity   Alcohol use: Yes    Alcohol/week: 10.0 standard drinks of alcohol    Types: 10 Glasses of wine per week    Comment: occasional   Drug use: No   Sexual activity: Not on file  Other Topics Concern   Not on file  Social History Narrative   Lives at home with wife.    Social Determinants of Health   Financial Resource Strain: Not on file  Food Insecurity: Low Risk  (06/16/2023)   [facility]   Food vital sign    Within the past 12 months, you worried that your food would run out before you got money to buy more: Never true    Within the past 12 months, the food you bought just didn't last  and you didn't have money to get more. : Never true  Transportation Needs: Not on file (06/16/2023)  Physical Activity: Not on file  Stress: Not on file  Social Connections: Unknown (04/22/2022)   Received from [facility]   Social Network    Social Network: Not on file  Intimate Partner Violence: Unknown (03/14/2022)   Received from [facility]   HITS    Physically Hurt: Not on file    Insult or Talk Down To: Not on file    Threaten Physical Harm: Not on file    Scream or Curse: Not on file     PHYSICAL EXAM  Vitals:   07/21/23 0938  BP: 115/82  Pulse: 70  Weight: 143 lb (64.9 kg)  Height: 5\' 8"  (1.727 m)    Body mass index is 21.74 kg/m.  Generalized: Well developed, in no acute distress  Cardiology: normal rate and rhythm, no murmur auscultated  Respiratory: clear to auscultation bilaterally    Neurological examination  Mentation: Alert oriented to time, place, history taking. Follows all commands speech and language fluent Cranial nerve II-XII: Pupils were equal round reactive to light. Extraocular movements were full, visual field were full on confrontational test. Facial sensation and strength were normal. Head turning and shoulder shrug  were normal and symmetric. Motor: The motor testing reveals 5 over 5 strength of all 4 extremities. Good symmetric motor tone is noted throughout.  Gait and station: Gait is normal.    DIAGNOSTIC DATA (LABS, IMAGING, TESTING) - I reviewed patient records, labs, notes, testing and imaging myself where available.  Lab Results  Component Value Date   WBC 5.3 11/23/2016   HGB 13.9 11/23/2016   HCT 39.4 11/23/2016   MCV 94.3 11/23/2016   PLT 168 11/23/2016      Component Value Date/Time   NA 133 (L) 07/15/2022 1430   K 4.5 07/15/2022 1430   CL 94 (L) 07/15/2022 1430   CO2 24 07/15/2022 1430   GLUCOSE 100 (H) 07/15/2022 1430   GLUCOSE 111 (H) 11/23/2016 2148   BUN 25 (H) 07/15/2022 1430   CREATININE 1.57 (H) 07/15/2022  1430   CALCIUM 9.0 07/15/2022 1430   PROT 6.3 07/15/2022 1430   ALBUMIN 4.5 07/15/2022  1430   AST 25 07/15/2022 1430   ALT 18 07/15/2022 1430   ALKPHOS 84 07/15/2022 1430   BILITOT 0.2 07/15/2022 1430   GFRNONAA >60 11/23/2016 2148   GFRAA >60 11/23/2016 2148   No results found for: "CHOL", "HDL", "LDLCALC", "LDLDIRECT", "TRIG", "CHOLHDL" No results found for: "HGBA1C" No results found for: "VITAMINB12" No results found for: "TSH"      No data to display               No data to display           ASSESSMENT AND PLAN  57 y.o. year old male  has a past medical history of Cervicalgia, GERD (gastroesophageal reflux disease), Myoclonus, Sleep apnea, and Trigeminal neuralgia. here with    Trigeminal neuralgia of right side of face - Plan: 10-Hydroxycarbazepine, Sodium  Catha Gosselin reports symptoms are well managed on oxcarbazepine 600mg  TID. He is tolerating dose well without obvious adverse effects. We will update labs. Will consider weaning dose in the future. We have discussed possible side effects and s/s of hyponatremia. He is not interested in decompression at this time.  Healthy lifestyle habits encouraged. He will follow up with PCP as directed. He will return to see me in 1 year, sooner if needed. He verbalizes understanding and agreement with this plan.   Orders Placed This Encounter  Procedures   10-Hydroxycarbazepine   Sodium     Meds ordered this encounter  Medications   Oxcarbazepine (TRILEPTAL) 300 MG tablet    Sig: Take 2 tablets (600 mg total) by mouth 3 (three) times daily.    Dispense:  540 tablet    Refill:  3    Order Specific Question:   Supervising Provider    Answer:   Anson Fret [[id number]]     Shawnie Dapper, MSN, FNP-C 07/21/2023, 10:18 AM  [facility] [address] [place], [place] [address] [phone]

## 2023-07-17 NOTE — Patient Instructions (Addendum)
Below is our plan:  We will continue oxcarbazepine 600mg  three times daily. We could consider reducing dose in the future if pain remains well controlled. Never abruptly stop med. I will update labs, today. Ask PCP about Dexa screening with CPE. May consider vitamin D and calcium supplement for bone health.   Please make sure you are staying well hydrated. I recommend 50-60 ounces daily. Well balanced diet and regular exercise encouraged. Consistent sleep schedule with 6-8 hours recommended.   Please continue follow up with care team as directed.   Follow up with me in 1 year   You may receive a survey regarding today's visit. I encourage you to leave honest feed back as I do use this information to improve patient care. Thank you for seeing me today!

## 2023-07-21 ENCOUNTER — Encounter: Payer: Self-pay | Admitting: Family Medicine

## 2023-07-21 ENCOUNTER — Ambulatory Visit (INDEPENDENT_AMBULATORY_CARE_PROVIDER_SITE_OTHER): Payer: 59 | Admitting: Family Medicine

## 2023-07-21 VITALS — BP 115/82 | HR 70 | Ht 68.0 in | Wt 143.0 lb

## 2023-07-21 DIAGNOSIS — G500 Trigeminal neuralgia: Secondary | ICD-10-CM

## 2023-07-21 MED ORDER — OXCARBAZEPINE 300 MG PO TABS: 600.0000 mg | ORAL_TABLET | Freq: Three times a day (TID) | ORAL | 3 refills | Status: DC

## 2023-07-22 ENCOUNTER — Ambulatory Visit (HOSPITAL_BASED_OUTPATIENT_CLINIC_OR_DEPARTMENT_OTHER)
Admission: RE | Admit: 2023-07-22 | Discharge: 2023-07-22 | Disposition: A | Discharge status: Home / Self Care | Payer: 59 | Source: Ambulatory Visit | Attending: Cardiovascular Disease | Admitting: Cardiovascular Disease

## 2023-07-22 ENCOUNTER — Encounter (HOSPITAL_BASED_OUTPATIENT_CLINIC_OR_DEPARTMENT_OTHER): Payer: Self-pay

## 2023-07-22 DIAGNOSIS — I951 Orthostatic hypotension: Secondary | ICD-10-CM | POA: Insufficient documentation

## 2023-07-22 DIAGNOSIS — I2584 Coronary atherosclerosis due to calcified coronary lesion: Secondary | ICD-10-CM | POA: Insufficient documentation

## 2023-07-22 DIAGNOSIS — I2510 Atherosclerotic heart disease of native coronary artery without angina pectoris: Secondary | ICD-10-CM | POA: Insufficient documentation

## 2023-07-22 DIAGNOSIS — G500 Trigeminal neuralgia: Secondary | ICD-10-CM | POA: Insufficient documentation

## 2023-08-01 ENCOUNTER — Encounter: Payer: Self-pay | Admitting: *Deleted

## 2023-09-25 ENCOUNTER — Encounter: Payer: Self-pay | Admitting: Cardiovascular Disease

## 2023-09-25 NOTE — Progress Notes (Signed)
Cardiology Office Note:    Date:  2023/10/19   ID:  Ronald Chapman, DOB [birth year], MRN [id number]  PCP:  Mattie Marlin, DO  Cardiologist:  Kristeen Miss, MD  Electrophysiologist:  None   Referring MD: Mattie Marlin, DO   Chief Complaint  Patient presents with   orthostatic hypotension   coronary calcification    History of Present Illness:    Ronald Chapman is a 57 y.o. male with a hx of hypertension.  Is recently been having some episodes of chest discomfort.  C/o tightness , radiation to left shoulder for past several months  Chest pain has worsened over the past several weeks.  Spontaneous,  Not related to exertion Fairly constant. Was riding indoor bike,  developed a sharp stabbing chest pain on the left side of his chest that lasted for less than a second.  He had 2 of these in a row.  He continued to ride at least 10-15 more minutes before completing his workout.  He did not have any recurrent episodes of chest discomfort.  Took a SL NTG this am and did not notice any difference Has been under lots of stress at home. 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  Sodium is 133.  Potassium is 4.4.  Glucose is 108.  Creatinine 0.9.  Liver enzymes are normal.     Total cholesterol is 195 Triglyceride level is 61 HDL is 77 LDL is 106  TSH is 1.37.  Dec. 6, 2022: Ronald Chapman is seen today for some recent syncope and presyncope Has had some lightheadedness after he works out very intensly.  We had him add electrolytes to his water.   Has orthostasis when he leans over to pick up something  Trileptal has dizziness listed as its first side effect.   February 06, 2022: Ronald Chapman is seen for follow up of his syncope and orthostatic hypotension   Started working out more again  - is working out in the am now  Still taking trileptal  for trigeminal neuralgia   Has lost 15 lbs,  has stopped alcohol.  Works out every day  Feeling great    June 20, 2023 Ronald Chapman is seen for follow up of his syncope and presyncope. Had some issues with his right sterno clavicular joint  [facility]  CT showed some coronary calcifications   No CP ,  works out 3 days a week , cardio + weights    Labs from [employer] system from September, 2023 reveal Total cholesterol is 161 Triglyceride level is 36 HDL is 74 LDL is 74  Fam Hx Mothers family    MGF- died of MI in his 57  Brother - died in 11s    10/19/2023 Ronald Chapman is seen for follow up for his coronary artery calcifications CAC score is 82.5 ( 54th percentile for age / sex matched controls ) LP(a) is 22.8 Lipids from 08/27/23 ( outside data) Chol = 151 Trig = 50 HDL = 69 LDL = 70      Past Medical History:  Diagnosis Date   Cervicalgia    GERD (gastroesophageal reflux disease)    Myoclonus    Sleep apnea    Trigeminal neuralgia     Past Surgical History:  Procedure Laterality Date   appendectomy     COLONOSCOPY  2020   HERNIA REPAIR     nose sugery  NOSE SURGERY   age 72    Current Medications: Current Meds  Medication Sig   gabapentin (NEURONTIN) 100 MG capsule Take 100 mg by mouth as needed.   Oxcarbazepine (TRILEPTAL) 300 MG tablet Take 2 tablets (600 mg total) by mouth 3 (three) times daily.     Allergies:   Patient has no known allergies.   Social History   Socioeconomic History   Marital status: Married    Spouse name: Not on file   Number of children: Not on file   Years of education: Not on file   Highest education level: Not on file  Occupational History   Occupation: IT trainer  Tobacco Use   Smoking status: Never   Smokeless tobacco: Never  Vaping Use   Vaping status: Never Used  Substance and Sexual Activity   Alcohol use: Yes    Alcohol/week: 10.0 standard drinks of alcohol    Types: 10 Glasses of wine per week    Comment: occasional   Drug use: No   Sexual activity: Not on file  Other Topics Concern    Not on file  Social History Narrative   Lives at home with wife.    Social Determinants of Health   Financial Resource Strain: Not on file  Food Insecurity: Low Risk  (08/27/2023)   Received from [facility]   Hunger Vital Sign    Worried About Running Out of Food in the Last Year: Never true    Ran Out of Food in the Last Year: Never true  Transportation Needs: No Transportation Needs (08/27/2023)   Received from [facility]    In the past 12 months, has lack of reliable transportation kept you from medical appointments, meetings, work or from getting things needed for daily living? : No  Physical Activity: Not on file  Stress: Not on file  Social Connections: Unknown (04/22/2022)   Received from [facility]   Social Network    Social Network: Not on file     Family History: The patient's family history includes Heart disease (age of onset: 26) in his maternal grandfather. There is no history of Headache.  ROS:   Please see the history of present illness.     All other systems reviewed and are negative.  EKGs/Labs/Other Studies Reviewed:     Recent Labs: 07/21/2023: Sodium 133  Recent Lipid Panel No results found for: "CHOL", "TRIG", "HDL", "CHOLHDL", "VLDL", "LDLCALC", "LDLDIRECT"  Physical Exam:     Physical Exam: Blood pressure 122/84, pulse 73, height 5\' 8"  (1.727 m), weight 145 lb (65.8 kg), SpO2 99%.      GEN:  Well nourished, well developed in no acute distress HEENT: Normal NECK: No JVD; No carotid bruits LYMPHATICS: No lymphadenopathy CARDIAC: RRR , no murmurs, rubs, gallops RESPIRATORY:  Clear to auscultation without rales, wheezing or rhonchi  ABDOMEN: Soft, non-tender, non-distended MUSCULOSKELETAL:  No edema; No deformity  SKIN: Warm and dry NEUROLOGIC:  Alert and oriented x 3     EKG:          ASSESSMENT:    No diagnosis found.    PLAN:      Orthostatic hypotension:  no longer has any  orthostasis    2.  Coronary artery calcifications:  CAC score is 82.5 His LDL is 70 .     We discussed natural methods to reduce cholesterol / coronary calcification  We discussed Callie and [employer], Discussed looking at [employer]  on natural ways to reduce cholesterol   We discussed Elijah Birk Esselstyn's book ( Prevent and Reverse Heart Disease ) which discusses eating a plant based diet .     Overall he is doing well. Will have him follow up with Dr. Timoteo Gaul in 1 year. I also suggested that he consider seeing Eligha Bridegroom , NP in the prevention    Medication Adjustments/Labs and Tests Ordered: Current medicines are reviewed at length with the patient today.  Concerns regarding medicines are outlined above.  No orders of the defined types were placed in this encounter.   No orders of the defined types were placed in this encounter.   Patient Instructions  Medication Instructions:  Your physician recommends that you continue on your current medications as directed. Please refer to the Current Medication list given to you today.  *If you need a refill on your cardiac medications before your next appointment, please call your pharmacy*  Lab Work: If you have labs (blood work) drawn today and your tests are completely normal, you will receive your results only by: MyChart Message (if you have MyChart) OR A paper copy in the mail If you have any lab test that is abnormal or we need to change your treatment, we will call you to review the results.  Follow-Up: At [facility], you and your health needs are our priority.  As part of our continuing mission to provide you with exceptional heart care, we have created designated Provider Care Teams.  These Care Teams include your primary Cardiologist (physician) and Advanced Practice Providers (APPs -  Physician Assistants and Nurse Practitioners) who all work together to provide you with  the care you need, when you need it.  We recommend signing up for the patient portal called "MyChart".  Sign up information is provided on this After Visit Summary.  MyChart is used to connect with patients for Virtual Visits (Telemedicine).  Patients are able to view lab/test results, encounter notes, upcoming appointments, etc.  Non-urgent messages can be sent to your provider as well.   To learn more about what you can do with MyChart, go to [email or web address].    Your next appointment:   1 year(s)  Provider:   Dr. Izora Ribas    Signed, Kristeen Miss, MD  09/29/2023 9:24 AM    [facility]

## 2023-09-29 ENCOUNTER — Encounter: Payer: Self-pay | Admitting: Cardiovascular Disease

## 2023-09-29 ENCOUNTER — Ambulatory Visit: Payer: 59 | Attending: Cardiovascular Disease | Admitting: Cardiovascular Disease

## 2023-09-29 VITALS — BP 122/84 | HR 73 | Ht 68.0 in | Wt 145.0 lb

## 2023-09-29 DIAGNOSIS — I2510 Atherosclerotic heart disease of native coronary artery without angina pectoris: Secondary | ICD-10-CM | POA: Diagnosis not present

## 2023-09-29 NOTE — Patient Instructions (Addendum)
Medication Instructions:  Your physician recommends that you continue on your current medications as directed. Please refer to the Current Medication list given to you today.  *If you need a refill on your cardiac medications before your next appointment, please call your pharmacy*  Lab Work: If you have labs (blood work) drawn today and your tests are completely normal, you will receive your results only by: MyChart Message (if you have MyChart) OR A paper copy in the mail If you have any lab test that is abnormal or we need to change your treatment, we will call you to review the results.  Follow-Up: At [facility], you and your health needs are our priority.  As part of our continuing mission to provide you with exceptional heart care, we have created designated Provider Care Teams.  These Care Teams include your primary Cardiologist (physician) and Advanced Practice Providers (APPs -  Physician Assistants and Nurse Practitioners) who all work together to provide you with the care you need, when you need it.  We recommend signing up for the patient portal called "MyChart".  Sign up information is provided on this After Visit Summary.  MyChart is used to connect with patients for Virtual Visits (Telemedicine).  Patients are able to view lab/test results, encounter notes, upcoming appointments, etc.  Non-urgent messages can be sent to your provider as well.   To learn more about what you can do with MyChart, go to [email or web address].    Your next appointment:   1 year(s)  Provider:   Dr. Izora Ribas

## 2024-01-29 ENCOUNTER — Telehealth: Payer: 59 | Admitting: Family Medicine

## 2024-07-20 ENCOUNTER — Encounter: Payer: Self-pay | Admitting: Family Medicine

## 2024-07-20 ENCOUNTER — Telehealth: Payer: Self-pay | Admitting: Family Medicine

## 2024-07-20 ENCOUNTER — Ambulatory Visit (INDEPENDENT_AMBULATORY_CARE_PROVIDER_SITE_OTHER): Payer: 59 | Admitting: Family Medicine

## 2024-07-20 VITALS — BP 132/80 | HR 65 | Ht 68.0 in | Wt 149.0 lb

## 2024-07-20 DIAGNOSIS — G500 Trigeminal neuralgia: Secondary | ICD-10-CM | POA: Diagnosis not present

## 2024-07-20 MED ORDER — OXCARBAZEPINE 300 MG PO TABS: 600.0000 mg | ORAL_TABLET | Freq: Three times a day (TID) | ORAL | 3 refills | Status: AC

## 2024-07-20 MED ORDER — GABAPENTIN 100 MG PO CAPS: 100.0000 mg | ORAL_CAPSULE | ORAL | 0 refills | Status: DC | PRN

## 2024-07-20 NOTE — Progress Notes (Signed)
 Chief Complaint  Patient presents with   RM2/Trigeminal Neuralgia    Pt is here Alone. Pt states that his Trigeminal Neuralgia has been about the same since his last appointment. Pt states that medication manages everything, but doesn't give him total relief.    HISTORY OF PRESENT ILLNESS:  07/20/24 ALL:  Ronald Chapman returns for follow up for TGN. He continues oxcarb 600mg  TID. He feels he is doing well. He is tolerating meds well. He has constant numbness around the right gumline and feels a pinching sensation of the right cheek. He does have rare sharp pains but feels pain is stable. Not having to take gabapentin  much at all. He would like to consider non invasive surgical options due to concerns of prolonged medication management. He was seen by Dr Cheryle in 2022 but not interested in surgical procedures at that time. He requests referral to [facility].   07/21/2023 ALL: Ronald Chapman returns for follow up for TGN. He continues oxcarb 600mg  TID. He feels he is doing well. He is tolerating med. Rare breakthrough pain. He has gabapentin  100mg  PRN for breakthrough pain but hasn't taken it. Pain usually resolves spontaneously within 24 hours. Stress is a trigger. He is seen annually by PCP. CPE labs unremarkable. Na 133 08/2022.   01/28/23 ALL (Mychart): Ronald Chapman is a 58 y.o. male here today for follow up for TGN. He continues oxcarbazepine  600mg  TID. He reports facial pain is well managed. He has not used gabapentin  recently. No OTC analgesics. Na was 133 at CPE in 08/2022. He drinks an electrolyte drink daily. He works out daily.   07/15/2022 ALL: Ronald Chapman is a 58 y.o. male here today for follow up for trigeminal neuralgia. He continues oxcarb 600mg  TID. He feels facial pain is stable. He has more of a numbness around the right gumline and feels a pinching sensation of the right cheek. He does have rare sharp pains but feels pain is stable. He has been on this dose for 18-20 years. MRI showed  vascular loop pressing on trigeminal nerve. He saw Dr Cheryle. He is not comfortable proceeding with surgery at this time.   HISTORY (copied from Dr Sharion previous note)  HPI:  Ronald Chapman is a 58 y.o. male here as requested by [name], [name], DO for numbness and pain right side of face/trigeminal neuralgia. PMHx sleep apnea, myoclonus, cervicalgia, trigeminal neuralgia. TGN on the right, started over 10 years ago, went to the dentist many times, prior to the trileptal  he was having stabbing/shooting pain, severe, unbearable, mostly in V3 branch, medicine has controlled that mostly, he may still have some stabbing pain that will come through, he has constant numbness in the same area, annoying not painful. Never had any procedures completed on the nerve. He used to clench a lot on the right side, grinding teeth, doesn't do that anymore, wore a mouth guard for a while. Triggers of the trigger pains would be cold temperatures, he has been drinking through a straw, it has been a long time and he can't really recall other triggers. Now much welel controlled on medication with some breakthrough pain sometime and numbness. Stress makes it worse, he is a high stress person. He has Gabapentin  prn. Been on trileptal  for years. He has been having new symptoms more on the right and on both sides. Pulsatile tinnitus, fairly all the time. Can be severe at night worse when laying down, can be very loud.   Reviewed notes, labs  and imaging from outside physicians, which showed:   08/07/2021: CMP with Na 132 otherwise unremarkable  I reviewed Dr. Leander notes: Patient has a history of trigeminal neuralgia, atypical facial pain when the patient sees neurology and had a nonspecific MRI of the brain diagnosed with trigeminal neuralgia and currently controlled on Trileptal , neurologist has retired and he would like to get a second opinion, bilateral tinnitus was due for MRI, he also has a fracture at C7 and the  left lamina and the articular pillar after skiing accident in 2011 treated with a neck brace for 3 months without any issues, has mild sleep apnea, tried CPAP machine but could not tolerate it.  I was able to locate neurology notes he was last seen in November 2021 by Dr. Cleotilde at [facility] neurolog[facility] and sleep, intermittent jerks of the extremities, 20-30 times a day, he is on Trileptal  150 mg to 3 times daily, occasionally dizziness, pressure back of head lasting an hour not overwhelming, some neck pain, Trileptal  has been associated with hyponatremia he was advised to increase his salt intake, lots of stress, in the setting of separation of his wife and illness and his pet.  It appears he had been being seen multiple times, he had an episode of passing out and could not talk felt fine after 30 minutes and given sugar, the twitching is slightly myoclonic-like movement more in the left, all appearing to be stress related.  He has been seen in the past with intermittent right facial pain that has been static for several years, the stabbing pain in the face is less responsive to the Trileptal  as of late, MRI of the head was last done February 2006 and was normal (by report), lots of stress, impatience, intolerance, inability to focus.  He also fell while skiing and he fractured his C7 seen by neurosurgery in [place] this was in 2011 February, numbing pain in the right face to the infraorbital and right area, near the eye, rates it 1-2 out of 10 in pain, no other health issues.  He has reported stabbing shooting pains in the area of the trigeminal nerve in the past with numbness, he also grinds his teeth.  I reviewed notes going back to 2016.  Neurologic exam was normal.   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 Prominent  vascular loop at the root entry zone of the right fifth nerve. There  could be vascular etiology of the right trigeminal neuralgia. MR  angiography suggested for more complete evaluation. (Dr. Cristal)   REVIEW OF SYSTEMS: Out of a complete 14 system review of symptoms, the patient complains only of the following symptoms, facial pain and paresthesias and all other reviewed systems are negative.   ALLERGIES: No Known Allergies   HOME MEDICATIONS: Outpatient Medications Prior to Visit  Medication Sig Dispense Refill   gabapentin  (NEURONTIN ) 100 MG capsule Take 100 mg by mouth as needed.     Oxcarbazepine  (TRILEPTAL ) 300 MG tablet Take 2 tablets (600 mg total) by mouth 3 (three) times daily. 540 tablet 3   No facility-administered medications prior to visit.     PAST MEDICAL HISTORY: Past Medical History:  Diagnosis Date   Cervicalgia    GERD (gastroesophageal reflux disease)    Myoclonus  Sleep apnea    Trigeminal neuralgia      PAST SURGICAL HISTORY: Past Surgical History:  Procedure Laterality Date   appendectomy     COLONOSCOPY  2020   HERNIA REPAIR     nose sugery     NOSE SURGERY   age 66     FAMILY HISTORY: Family History  Problem Relation Age of Onset   Heart disease Maternal Grandfather 49   Headache Neg Hx      SOCIAL HISTORY: Social History   Socioeconomic History   Marital status: Married    Spouse name: Not on file   Number of children: Not on file   Years of education: Not on file   Highest education level: Not on file  Occupational History   Occupation: IT trainer  Tobacco Use   Smoking status: Never   Smokeless tobacco: Never  Vaping Use   Vaping status: Never Used  Substance and Sexual Activity   Alcohol use: Yes    Alcohol/week: 10.0 standard drinks of alcohol    Types: 10 Glasses of wine per week    Comment: occasional   Drug  use: No   Sexual activity: Not on file  Other Topics Concern   Not on file  Social History Narrative   Lives at home with wife.    Social Drivers of Corporate investment banker Strain: Not on file  Food Insecurity: Low Risk  (08/27/2023)   Received from [facility]   Hunger Vital Sign    Within the past 12 months, you worried that your food would run out before you got money to buy more: Never true    Within the past 12 months, the food you bought just didn't last and you didn't have money to get more. : Never true  Transportation Needs: No Transportation Needs (08/27/2023)   Received from [facility]    In the past 12 months, has lack of reliable transportation kept you from medical appointments, meetings, work or from getting things needed for daily living? : No  Physical Activity: Not on file  Stress: Not on file  Social Connections: Unknown (04/22/2022)   Received from [facility]   Social Network    Social Network: Not on file  Intimate Partner Violence: Unknown (03/14/2022)   Received from [facility]   HITS    Physically Hurt: Not on file    Insult or Talk Down To: Not on file    Threaten Physical Harm: Not on file    Scream or Curse: Not on file     PHYSICAL EXAM  Vitals:   07/20/24 1032  BP: 132/80  Pulse: 65  SpO2: 99%  Weight: 149 lb (67.6 kg)  Height: 5' 8 (1.727 m)     Body mass index is 22.66 kg/m.  Generalized: Well developed, in no acute distress  Cardiology: normal rate and rhythm, no murmur auscultated  Respiratory: clear to auscultation bilaterally    Neurological examination  Mentation: Alert oriented to time, place, history taking. Follows all commands speech and language fluent Cranial nerve II-XII: Pupils were equal round reactive to light. Extraocular movements were full, visual field were full on confrontational test. Facial sensation and strength were normal. Head turning and shoulder shrug  were normal and  symmetric. Motor: The motor testing reveals 5 over 5 strength of all 4 extremities. Good symmetric motor tone is noted throughout.  Gait and station: Gait is normal.    DIAGNOSTIC DATA (LABS, IMAGING, TESTING) -  I reviewed patient records, labs, notes, testing and imaging myself where available.  Lab Results  Component Value Date   WBC 5.3 11/23/2016   HGB 13.9 11/23/2016   HCT 39.4 11/23/2016   MCV 94.3 11/23/2016   PLT 168 11/23/2016      Component Value Date/Time   NA 133 (L) 07/21/2023 0958   K 4.5 07/15/2022 1430   CL 94 (L) 07/15/2022 1430   CO2 24 07/15/2022 1430   GLUCOSE 100 (H) 07/15/2022 1430   GLUCOSE 111 (H) 11/23/2016 2148   BUN 25 (H) 07/15/2022 1430   CREATININE 1.57 (H) 07/15/2022 1430   CALCIUM 9.0 07/15/2022 1430   PROT 6.3 07/15/2022 1430   ALBUMIN 4.5 07/15/2022 1430   AST 25 07/15/2022 1430   ALT 18 07/15/2022 1430   ALKPHOS 84 07/15/2022 1430   BILITOT 0.2 07/15/2022 1430   GFRNONAA >60 11/23/2016 2148   GFRAA >60 11/23/2016 2148   No results found for: CHOL, HDL, LDLCALC, LDLDIRECT, TRIG, CHOLHDL No results found for: YHAJ8R No results found for: VITAMINB12 No results found for: TSH      No data to display               No data to display           ASSESSMENT AND PLAN  58 y.o. year old male  has a past medical history of Cervicalgia, GERD (gastroesophageal reflux disease), Myoclonus, Sleep apnea, and Trigeminal neuralgia. here with    Trigeminal neuralgia - Plan: CMP, Oxcarbazepine  (Trileptal ), Serum, Ambulatory referral to Neurosurgery  Ozell FORBES Silvan reports symptoms are well managed on oxcarbazepine  600mg  TID. He is tolerating dose well without obvious adverse effects. We will update labs. He has been unable to wean dose. We have discussed possible treatment options, potential side effects and s/s of hyponatremia. He is not interested in decompression at this time but would consider non invasive procedures  if a candidate. I will place referral to [facility]. Healthy lifestyle habits encouraged. He will follow up with PCP as directed. He will return to see me in 1 year, sooner if needed. He verbalizes understanding and agreement with this plan.    Orders Placed This Encounter  Procedures   CMP   Oxcarbazepine  (Trileptal ), Serum   Ambulatory referral to Neurosurgery    Referral Priority:   Routine    Referral Type:   Surgical    Referral Reason:   Specialty Services Required    Requested Specialty:   Neurosurgery    Number of Visits Requested:   1     Meds ordered this encounter  Medications   Oxcarbazepine  (TRILEPTAL ) 300 MG tablet    Sig: Take 2 tablets (600 mg total) by mouth 3 (three) times daily.    Dispense:  540 tablet    Refill:  3    Supervising Provider:   AHERN, ANTONIA B [[id number]]   gabapentin  (NEURONTIN ) 100 MG capsule    Sig: Take 1 capsule (100 mg total) by mouth as needed.    Dispense:  90 capsule    Refill:  0    Please do not fill until pt calls. Has some at home but rx expired.    Supervising Provider:   INES ONETHA NOVAK [[id number]]   I personally spent a total of 30 minutes in the care of the patient today including preparing to see the patient, getting/reviewing separately obtained history, performing a medically appropriate exam/evaluation, counseling and educating, placing orders, referring and communicating with other  health care professionals, documenting clinical information in the EHR, independently interpreting results, and communicating results.   Greig Forbes, MSN, FNP-C 07/20/2024, 1:59 PM  [facility] [address] [place], [place] [address] [phone]

## 2024-07-20 NOTE — Telephone Encounter (Signed)
Referral  for neurosurgery fax to [facility]. Phone: [phone], Fax: [phone] 

## 2024-07-20 NOTE — Patient Instructions (Signed)
 Below is our plan:  We will continue oxcarbazepine  600mg  three times daily and gabapentin  as needed. I have placed a referral to neurosurgery for discussion of possible non invasive treatment options for TN. Let me know if you do not hear back to schedule appt.   Please make sure you are staying well hydrated. I recommend 50-60 ounces daily. Well balanced diet and regular exercise encouraged. Consistent sleep schedule with 6-8 hours recommended.   Please continue follow up with care team as directed.   Follow up with me in 1 year   You may receive a survey regarding today's visit. I encourage you to leave honest feed back as I do use this information to improve patient care. Thank you for seeing me today!

## 2024-07-21 ENCOUNTER — Telehealth: Payer: Self-pay | Admitting: *Deleted

## 2024-07-21 MED ORDER — GABAPENTIN 100 MG PO CAPS: 100.0000 mg | ORAL_CAPSULE | Freq: Every evening | ORAL | 0 refills | Status: AC | PRN

## 2024-07-21 NOTE — Telephone Encounter (Signed)
 Amy [facility] sent a fax stating they need the directions/frequency for gabapentin  100 mg tablets Rx that was sent yesterday.   I have Rx pending to be re-sent to pharmacy with updated directions.

## 2024-07-24 LAB — COMPREHENSIVE METABOLIC PANEL WITH GFR
ALT: 15 IU/L (ref 0–44)
AST: 21 IU/L (ref 0–40)
Albumin: 4.6 g/dL (ref 3.8–4.9)
Alkaline Phosphatase: 72 IU/L (ref 44–121)
BUN/Creatinine Ratio: 22 — ABNORMAL HIGH (ref 9–20)
BUN: 21 mg/dL (ref 6–24)
Bilirubin Total: 0.5 mg/dL (ref 0.0–1.2)
CO2: 23 mmol/L (ref 20–29)
Calcium: 9.1 mg/dL (ref 8.7–10.2)
Chloride: 96 mmol/L (ref 96–106)
Creatinine, Ser: 0.97 mg/dL (ref 0.76–1.27)
Globulin, Total: 2.2 g/dL (ref 1.5–4.5)
Glucose: 97 mg/dL (ref 70–99)
Potassium: 4.6 mmol/L (ref 3.5–5.2)
Sodium: 134 mmol/L (ref 134–144)
Total Protein: 6.8 g/dL (ref 6.0–8.5)
eGFR: 92 mL/min/1.73 (ref >59)

## 2024-07-24 LAB — OXCARBAZEPINE (TRILEPTAL), SERUM: Oxcarbazepine SerPl-Mcnc: 20 ug/mL (ref 10–35)

## 2024-07-27 ENCOUNTER — Ambulatory Visit: Payer: Self-pay | Admitting: Family Medicine

## 2025-07-18 ENCOUNTER — Ambulatory Visit: Admitting: Family Medicine

## 2025-07-20 ENCOUNTER — Ambulatory Visit: Admitting: Family Medicine
